# Patient Record
Sex: Male | Born: 1935 | Race: White | Hispanic: No | Marital: Married | Smoking: Former smoker
Health system: Southern US, Community
[De-identification: ages and names within clinical notes are randomized; demographics above are authoritative.]

## PROBLEM LIST (undated history)

## (undated) DIAGNOSIS — R918 Other nonspecific abnormal finding of lung field: Secondary | ICD-10-CM

## (undated) DIAGNOSIS — E7800 Pure hypercholesterolemia, unspecified: Secondary | ICD-10-CM

## (undated) DIAGNOSIS — R0602 Shortness of breath: Secondary | ICD-10-CM

## (undated) DIAGNOSIS — E039 Hypothyroidism, unspecified: Secondary | ICD-10-CM

## (undated) DIAGNOSIS — J449 Chronic obstructive pulmonary disease, unspecified: Secondary | ICD-10-CM

## (undated) DIAGNOSIS — M1990 Unspecified osteoarthritis, unspecified site: Secondary | ICD-10-CM

## (undated) DIAGNOSIS — C801 Malignant (primary) neoplasm, unspecified: Secondary | ICD-10-CM

## (undated) DIAGNOSIS — I499 Cardiac arrhythmia, unspecified: Secondary | ICD-10-CM

## (undated) DIAGNOSIS — Z930 Tracheostomy status: Secondary | ICD-10-CM

## (undated) DIAGNOSIS — J189 Pneumonia, unspecified organism: Secondary | ICD-10-CM

## (undated) DIAGNOSIS — I10 Essential (primary) hypertension: Secondary | ICD-10-CM

## (undated) DIAGNOSIS — E119 Type 2 diabetes mellitus without complications: Secondary | ICD-10-CM

## (undated) DIAGNOSIS — R52 Pain, unspecified: Secondary | ICD-10-CM

## (undated) HISTORY — PX: EYE SURGERY: SHX253

---

## 2001-06-17 HISTORY — PX: TOTAL LARYNGECTOMY: SHX2543

## 2009-03-30 ENCOUNTER — Other Ambulatory Visit: Admission: RE | Admit: 2009-03-30 | Discharge: 2009-03-30 | Payer: Self-pay | Admitting: Internal Medicine

## 2009-03-30 HISTORY — PX: BRONCHOSCOPY: SUR163

## 2009-04-24 HISTORY — PX: OTHER SURGICAL HISTORY: SHX169

## 2012-04-09 HISTORY — PX: BACK SURGERY: SHX140

## 2013-05-20 ENCOUNTER — Encounter (HOSPITAL_COMMUNITY): Payer: Self-pay | Admitting: Pharmacy Technician

## 2013-05-22 NOTE — H&P (Signed)
Ryan Salinas DOB: 01/19/1935 Male  Chief Complaint: right knee pain  History of Present Illness The patient is a 79 year old male who presented with a chief complaint of right knee pain. He reports that he has had right knee pain for about 6 weeks. No specific injury. He has had no prior history of knee problems. He says that the pain has become so severe that he is nearly unable to put weight on the right knee. He has minimal to no discomfort at rest. The pain is typically associated with weightbearing. He is pointing to all his pain being medially. He is having some issues with swelling and instability in the knee. Says he feels like something is getting caught in the knee. He has seen his primary care provider for this problem who has done x-rays, CT of the knee, as well as a cortisone injection. He says that the cortisone injection helped for a couple of weeks, but has worn off. He reports that he and his wife have been walking in the park for about 6 years, but obviously in the past month he has been unable to do that. He has been taking some Aleve, but is trying to get off of taking that. It does complicate some of his pulmonary issues. He denies groin pain. No numbness or tingling in the legs. MRI was ordered which showed a medial meniscus tear in the right knee.   Allergies Morphine Derivatives Cipro *FLUOROQUINOLONES*  Family History Cancer. mother Diabetes Mellitus. sister and brother Kidney disease. brother  Social History Alcohol use. current drinker; drinks beer and wine; only occasionally per week Children. 2 Current work status. 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 married Tobacco / smoke exposure. no Tobacco use. former smoker; smoke(d) 1 pack(s) per day  Medication History AmLODIPine Besylate (5MG  Tablet, Oral) Active. Benazepril HCl (40MG  Tablet, Oral) Active. MetFORMIN HCl ER (500MG  Tablet ER 24HR, Oral)  Active. Levothyroxine Sodium (100MCG Tablet, Oral) Active. PredniSONE (2.5MG  Tablet, Oral) Active. Labetalol HCl (300MG  Tablet, Oral) Active. CloNIDine HCl (0.1MG  Tablet, Oral) Active. GlipiZIDE XL (10MG  Tablet ER 24HR, Oral) Active. Furosemide (40MG  Tablet, Oral) Active. Pravastatin Sodium (20MG  Tablet, Oral) Active. Albuterol Sulfate (0.63MG /3ML Nebulized Soln, Inhalation) Active. Budesonide (0.25MG /2ML Suspension, Inhalation) Active. Sodium Chloride (Inhalant) (0.9% Nebulized Soln, Inhalation) Active. Aspirin (81MG  Tablet, 1 (one) Oral) Active.  Past Surgical History Cataract Surgery. bilateral Colon Polyp Removal - Colonoscopy Colon Polyp Removal - Open Spinal Fusion. lower back Spinal Surgery Tonsillectomy Vasectomy  Past Medical History Cancer Chronic Obstructive Lung Disease Diabetes Mellitus, Type II Heart murmur Hypertension Hypercholesterolemia Rheumatoid Arthritis  Review of Systems General:Not Present- Chills, Fever, Night Sweats, Appetite Loss, Fatigue, Feeling sick, Weight Gain and Weight Loss. Skin:Not Present- Itching, Rash, Skin Color Changes, Ulcer, Psoriasis and Change in Hair or Nails. HEENT:Not Present- Sensitivity to light, Hearing problems, Nose Bleed and Ringing in the Ears. Neck:Not Present- Swollen Glands and Neck Mass. Respiratory:Present- Dyspnea. Not Present- Snoring, Chronic Cough and Bloody sputum. Cardiovascular:Present- Shortness of Breath, Swelling of Extremities and Palpitations. Not Present- Chest Pain and Leg Cramps. Gastrointestinal:Present- Heartburn. Not Present- Bloody Stool, Abdominal Pain, Vomiting, Nausea and Incontinence of Stool. Male Genitourinary:Present- Frequency and Nocturia. Not Present- Blood in Urine and Incontinence. Musculoskeletal:Present- Back Pain. Not Present- Muscle Weakness, Muscle Pain, Joint Stiffness, Joint Swelling and Joint Pain. Neurological:Not Present- Tingling, Numbness, Burning, Tremor,  Headaches and Dizziness. Psychiatric:Not Present- Anxiety, Depression and Memory Loss. Endocrine:Not Present- Cold Intolerance, Heat Intolerance, Excessive hunger and Excessive Thirst. Hematology:Not Present-  Abnormal Bleeding, Anemia, Blood Clots and Easy Bruising.  Vitals Weight: 229 lb Height: 66 in Body Surface Area: 2.2 m Body Mass Index: 36.96 kg/m Pulse: 75 (Regular) BP: 155/87 (Sitting, Left Arm, Standard)  Physical Exam On physical exam he is alert and oriented. He is in no acute distress. He is nontender over the lateral joint line, but is significantly tender over the medial joint line. Calves are soft and nontender. Distal pulses are 2+. Sensation and motor function is intact in the lower extremities. He does have moderate patellofemoral crepitus. Mild effusion about the knee. Does have some tenderness in the popliteal space. Small Baker cyst is palpated. He does have pain with passive range of motion primarily at the endpoint of extension. He does lack a couple of degrees of extension. Minimal discomfort with flexion. Flexion back to 125 degrees. No instability is noted in the knee. RRR. Heart sounds normal. Lungs clear to auscultation. Abdomen soft and nontender. Bowel sounds active. Neck supple. EOM intact.   RADIOGRAPHS AP and lateral views of the right knee show some narrowing in all 3 compartments, but no bone on bone changes. No fractures. Does have some mild chondrocalcinosis in the medial compartment. The CT ordered by his PCP showed some early arthritic changes as well as a Baker cyst. No fractures.  Assessment & Plan Right knee medial meniscus tear He needs a right knee arthroscopy with medial menisectomy. Risks and benefits of the surgery discussed with the patient and his wife by Dr. Gladstone Lighter.     Ardeen Jourdain, PA-C

## 2013-05-23 ENCOUNTER — Encounter (HOSPITAL_COMMUNITY): Payer: Self-pay

## 2013-05-23 ENCOUNTER — Encounter (HOSPITAL_COMMUNITY)
Admission: RE | Admit: 2013-05-23 | Discharge: 2013-05-23 | Disposition: A | Discharge status: Home / Self Care | Payer: Medicare Other | Source: Ambulatory Visit | Attending: Orthopedic Surgery | Admitting: Orthopedic Surgery

## 2013-05-23 HISTORY — DX: Pain, unspecified: R52

## 2013-05-23 HISTORY — DX: Cardiac arrhythmia, unspecified: I49.9

## 2013-05-23 HISTORY — DX: Shortness of breath: R06.02

## 2013-05-23 HISTORY — DX: Pure hypercholesterolemia, unspecified: E78.00

## 2013-05-23 HISTORY — DX: Hypothyroidism, unspecified: E03.9

## 2013-05-23 HISTORY — DX: Malignant (primary) neoplasm, unspecified: C80.1

## 2013-05-23 HISTORY — DX: Other nonspecific abnormal finding of lung field: R91.8

## 2013-05-23 HISTORY — DX: Unspecified osteoarthritis, unspecified site: M19.90

## 2013-05-23 HISTORY — DX: Type 2 diabetes mellitus without complications: E11.9

## 2013-05-23 HISTORY — DX: Chronic obstructive pulmonary disease, unspecified: J44.9

## 2013-05-23 HISTORY — DX: Tracheostomy status: Z93.0

## 2013-05-23 HISTORY — DX: Essential (primary) hypertension: I10

## 2013-05-23 NOTE — Pre-Procedure Instructions (Signed)
DR. ROSE NOTIFIED PT HAS TRACHEOSTOMY - HX TOTAL LARYNGECTOMY FOR CA IN 2003; DIABETES, HYPERTENSION.  PT HAS CBC, DIFF, CMET AND OTHER LABS DONE BY HIS MEDICAL DOCTOR DR. Holley Raring ON 05/02/13, EKG AND CXR REPORTS 05/16/13 FROM DR. ARVIND - DR. ROSE STATES OK TO USE ALL REPORT FOR THIS SURGERY - NO OTHER LABS NEEDED. MEDICAL CLEARANCE FOR SURGERY AND OFFICE NOTES DR. ARVIND - [place]  05/16/13  ON PT'S CHART. PULMONOLOGY OFFICE NOTES DR. DONNER WITH [facility] ON CHART FROM 05/03/13. CARDIOLOGY OFFICE NOTE DR. [place] ON PT'S CHART FROM 04/21/13.

## 2013-05-23 NOTE — Patient Instructions (Signed)
   YOUR SURGERY IS SCHEDULED AT [facility]  ON:   Wednesday  3/4  REPORT TO  SHORT STAY CENTER AT:  6:30 AM      PHONE # FOR SHORT STAY IS [phone]  DO NOT EAT OR DRINK ANYTHING AFTER MIDNIGHT THE NIGHT BEFORE YOUR SURGERY.  YOU MAY BRUSH YOUR TEETH, RINSE OUT YOUR MOUTH--BUT NO WATER, NO FOOD, NO CHEWING GUM, NO MINTS, NO CANDIES, NO CHEWING TOBACCO.  PLEASE TAKE THE FOLLOWING MEDICATIONS THE AM OF YOUR SURGERY WITH A FEW SIPS OF WATER:  USE ALL OF YOUR NEBULIZER MEDICATIONS.  TAKE YOUR AMLODIPINE. LABETALOL, LEVOTHYROXINE, PREDNISONE.   IF YOU ARE DIABETIC:  DO NOT TAKE ANY DIABETIC MEDICATIONS THE AM OF YOUR SURGERY.     DO NOT BRING VALUABLES, MONEY, CREDIT CARDS.  DO NOT WEAR JEWELRY, MAKE-UP, NAIL POLISH AND NO METAL PINS OR CLIPS IN YOUR HAIR. CONTACT LENS, DENTURES / PARTIALS, GLASSES SHOULD NOT BE WORN TO SURGERY AND IN MOST CASES-HEARING AIDS WILL NEED TO BE REMOVED.  BRING YOUR GLASSES CASE, ANY EQUIPMENT NEEDED FOR YOUR CONTACT LENS. FOR PATIENTS ADMITTED TO THE HOSPITAL--CHECK OUT TIME THE DAY OF DISCHARGE IS 11:00 AM.  ALL INPATIENT ROOMS ARE PRIVATE - WITH BATHROOM, TELEPHONE, TELEVISION AND WIFI INTERNET.  IF YOU ARE BEING DISCHARGED THE SAME DAY OF YOUR SURGERY--YOU CAN NOT DRIVE YOURSELF HOME--AND SHOULD NOT GO HOME ALONE BY TAXI OR BUS.  NO DRIVING OR OPERATING MACHINERY, OR MAKING LEGAL DECISIONS FOR 24 HOURS FOLLOWING ANESTHESIA / PAIN MEDICATIONS.  PLEASE MAKE ARRANGEMENTS FOR SOMEONE TO BE WITH YOU AT HOME THE FIRST 24 HOURS AFTER SURGERY. RESPONSIBLE DRIVER'S NAME / PHONE                                                WIFE - Ryan Salinas  [phone]                                                     FAILURE TO FOLLOW THESE INSTRUCTIONS MAY RESULT IN THE CANCELLATION OF YOUR SURGERY. PLEASE BE AWARE THAT YOU MAY NEED ADDITIONAL BLOOD DRAWN DAY OF YOUR SURGERY  PATIENT SIGNATURE_________________________________

## 2013-05-23 NOTE — Progress Notes (Signed)
05/23/13 1030  OBSTRUCTIVE SLEEP APNEA  Have you ever been diagnosed with sleep apnea through a sleep study? No  Do you snore loudly (loud enough to be heard through closed doors)?  0  Do you often feel tired, fatigued, or sleepy during the daytime? 1  Has anyone observed you stop breathing during your sleep? 0  Do you have, or are you being treated for high blood pressure? 1  BMI more than 35 kg/m2? 1  Age over 77 years old? 1  Neck circumference greater than 40 cm/18 inches? 1  Gender: 1  Obstructive Sleep Apnea Score 6  Score 4 or greater  Results sent to PCP

## 2013-05-25 ENCOUNTER — Encounter (HOSPITAL_COMMUNITY): Payer: Medicare Other | Admitting: Certified Registered Nurse Anesthetist

## 2013-05-25 ENCOUNTER — Ambulatory Visit (HOSPITAL_COMMUNITY)
Admission: RE | Admit: 2013-05-25 | Discharge: 2013-05-25 | Disposition: A | Discharge status: Home / Self Care | Payer: Medicare Other | Source: Ambulatory Visit | Attending: Orthopedic Surgery | Admitting: Orthopedic Surgery

## 2013-05-25 ENCOUNTER — Ambulatory Visit (HOSPITAL_COMMUNITY): Payer: Medicare Other | Admitting: Certified Registered Nurse Anesthetist

## 2013-05-25 ENCOUNTER — Encounter (HOSPITAL_COMMUNITY)
Admission: RE | Disposition: A | Discharge status: Home / Self Care | Payer: Self-pay | Source: Ambulatory Visit | Attending: Orthopedic Surgery

## 2013-05-25 ENCOUNTER — Encounter (HOSPITAL_COMMUNITY): Payer: Self-pay | Admitting: *Deleted

## 2013-05-25 DIAGNOSIS — X58XXXA Exposure to other specified factors, initial encounter: Secondary | ICD-10-CM | POA: Insufficient documentation

## 2013-05-25 DIAGNOSIS — M2240 Chondromalacia patellae, unspecified knee: Secondary | ICD-10-CM | POA: Insufficient documentation

## 2013-05-25 DIAGNOSIS — E7800 Pure hypercholesterolemia, unspecified: Secondary | ICD-10-CM | POA: Insufficient documentation

## 2013-05-25 DIAGNOSIS — Z7982 Long term (current) use of aspirin: Secondary | ICD-10-CM | POA: Insufficient documentation

## 2013-05-25 DIAGNOSIS — I10 Essential (primary) hypertension: Secondary | ICD-10-CM | POA: Insufficient documentation

## 2013-05-25 DIAGNOSIS — M2340 Loose body in knee, unspecified knee: Secondary | ICD-10-CM | POA: Insufficient documentation

## 2013-05-25 DIAGNOSIS — S83289A Other tear of lateral meniscus, current injury, unspecified knee, initial encounter: Secondary | ICD-10-CM | POA: Diagnosis present

## 2013-05-25 DIAGNOSIS — R011 Cardiac murmur, unspecified: Secondary | ICD-10-CM | POA: Insufficient documentation

## 2013-05-25 DIAGNOSIS — E039 Hypothyroidism, unspecified: Secondary | ICD-10-CM | POA: Insufficient documentation

## 2013-05-25 DIAGNOSIS — Z87891 Personal history of nicotine dependence: Secondary | ICD-10-CM | POA: Insufficient documentation

## 2013-05-25 DIAGNOSIS — M069 Rheumatoid arthritis, unspecified: Secondary | ICD-10-CM | POA: Insufficient documentation

## 2013-05-25 DIAGNOSIS — Z79899 Other long term (current) drug therapy: Secondary | ICD-10-CM | POA: Insufficient documentation

## 2013-05-25 DIAGNOSIS — E119 Type 2 diabetes mellitus without complications: Secondary | ICD-10-CM | POA: Insufficient documentation

## 2013-05-25 DIAGNOSIS — M659 Unspecified synovitis and tenosynovitis, unspecified site: Secondary | ICD-10-CM | POA: Insufficient documentation

## 2013-05-25 DIAGNOSIS — M1711 Unilateral primary osteoarthritis, right knee: Secondary | ICD-10-CM | POA: Diagnosis present

## 2013-05-25 DIAGNOSIS — J449 Chronic obstructive pulmonary disease, unspecified: Secondary | ICD-10-CM | POA: Insufficient documentation

## 2013-05-25 DIAGNOSIS — M1710 Unilateral primary osteoarthritis, unspecified knee: Secondary | ICD-10-CM | POA: Insufficient documentation

## 2013-05-25 DIAGNOSIS — J4489 Other specified chronic obstructive pulmonary disease: Secondary | ICD-10-CM | POA: Insufficient documentation

## 2013-05-25 HISTORY — PX: KNEE ARTHROSCOPY WITH MEDIAL MENISECTOMY: SHX5651

## 2013-05-25 LAB — GLUCOSE, CAPILLARY: Glucose-Capillary: 167 mg/dL — ABNORMAL HIGH (ref 70–99)

## 2013-05-25 SURGERY — ARTHROSCOPY, KNEE, WITH MEDIAL MENISCECTOMY
Anesthesia: General | Site: Knee | Laterality: Right

## 2013-05-25 MED ORDER — LACTATED RINGERS IV SOLN
INTRAVENOUS | Status: DC
  Administered 2013-05-25 (×2): via INTRAVENOUS

## 2013-05-25 MED ORDER — PROPOFOL 10 MG/ML IV BOLUS
INTRAVENOUS | Status: DC | PRN
  Administered 2013-05-25: 180 mg via INTRAVENOUS

## 2013-05-25 MED ORDER — PROPOFOL 10 MG/ML IV BOLUS
INTRAVENOUS | Status: AC
  Filled 2013-05-25: qty 20

## 2013-05-25 MED ORDER — LACTATED RINGERS IR SOLN
Status: DC | PRN
  Administered 2013-05-25: 6000 mL

## 2013-05-25 MED ORDER — LIDOCAINE HCL (CARDIAC) 20 MG/ML IV SOLN
INTRAVENOUS | Status: AC
  Filled 2013-05-25: qty 5

## 2013-05-25 MED ORDER — LIDOCAINE HCL (CARDIAC) 20 MG/ML IV SOLN
INTRAVENOUS | Status: DC | PRN
  Administered 2013-05-25: 80 mg via INTRAVENOUS

## 2013-05-25 MED ORDER — EPHEDRINE SULFATE 50 MG/ML IJ SOLN
INTRAMUSCULAR | Status: AC
  Filled 2013-05-25: qty 1

## 2013-05-25 MED ORDER — MIDAZOLAM HCL 5 MG/5ML IJ SOLN
INTRAMUSCULAR | Status: DC | PRN
  Administered 2013-05-25: 1 mg via INTRAVENOUS

## 2013-05-25 MED ORDER — ONDANSETRON HCL 4 MG/2ML IJ SOLN
INTRAMUSCULAR | Status: DC | PRN
  Administered 2013-05-25: 4 mg via INTRAVENOUS

## 2013-05-25 MED ORDER — BUPIVACAINE-EPINEPHRINE PF 0.25-1:200000 % IJ SOLN
INTRAMUSCULAR | Status: AC
  Filled 2013-05-25: qty 30

## 2013-05-25 MED ORDER — PROMETHAZINE HCL 25 MG/ML IJ SOLN: 6.2500 mg | INTRAMUSCULAR | Status: DC | PRN

## 2013-05-25 MED ORDER — BUPIVACAINE-EPINEPHRINE 0.25% -1:200000 IJ SOLN
INTRAMUSCULAR | Status: DC | PRN
  Administered 2013-05-25: 30 mL

## 2013-05-25 MED ORDER — BACITRACIN-NEOMYCIN-POLYMYXIN 400-5-5000 EX OINT
TOPICAL_OINTMENT | CUTANEOUS | Status: AC
  Filled 2013-05-25: qty 1

## 2013-05-25 MED ORDER — ATROPINE SULFATE 0.4 MG/ML IJ SOLN
INTRAMUSCULAR | Status: AC
  Filled 2013-05-25: qty 1

## 2013-05-25 MED ORDER — ONDANSETRON HCL 4 MG/2ML IJ SOLN
INTRAMUSCULAR | Status: AC
  Filled 2013-05-25: qty 2

## 2013-05-25 MED ORDER — SODIUM CHLORIDE 0.9 % IJ SOLN
INTRAMUSCULAR | Status: AC
  Filled 2013-05-25: qty 10

## 2013-05-25 MED ORDER — CHLORHEXIDINE GLUCONATE 4 % EX LIQD: 60.0000 mL | Freq: Once | CUTANEOUS | Status: DC

## 2013-05-25 MED ORDER — BACITRACIN ZINC 500 UNIT/GM EX OINT
TOPICAL_OINTMENT | CUTANEOUS | Status: DC | PRN
  Administered 2013-05-25: 1 via TOPICAL

## 2013-05-25 MED ORDER — OXYCODONE-ACETAMINOPHEN 5-325 MG PO TABS
1.0000 | ORAL_TABLET | Freq: Once | ORAL | Status: AC
  Administered 2013-05-25: 1 via ORAL
  Filled 2013-05-25: qty 1

## 2013-05-25 MED ORDER — CEFAZOLIN SODIUM-DEXTROSE 2-3 GM-% IV SOLR
INTRAVENOUS | Status: AC
  Filled 2013-05-25: qty 50

## 2013-05-25 MED ORDER — FENTANYL CITRATE 0.05 MG/ML IJ SOLN
25.0000 ug | INTRAMUSCULAR | Status: DC | PRN
  Administered 2013-05-25: 25 ug via INTRAVENOUS

## 2013-05-25 MED ORDER — LACTATED RINGERS IV SOLN: INTRAVENOUS | Status: DC

## 2013-05-25 MED ORDER — PHENYLEPHRINE 40 MCG/ML (10ML) SYRINGE FOR IV PUSH (FOR BLOOD PRESSURE SUPPORT)
PREFILLED_SYRINGE | INTRAVENOUS | Status: AC
  Filled 2013-05-25: qty 10

## 2013-05-25 MED ORDER — BUPIVACAINE-EPINEPHRINE PF 0.5-1:200000 % IJ SOLN
INTRAMUSCULAR | Status: AC
  Filled 2013-05-25: qty 30

## 2013-05-25 MED ORDER — FENTANYL CITRATE 0.05 MG/ML IJ SOLN
INTRAMUSCULAR | Status: AC
  Filled 2013-05-25: qty 5

## 2013-05-25 MED ORDER — FENTANYL CITRATE 0.05 MG/ML IJ SOLN
INTRAMUSCULAR | Status: DC | PRN
  Administered 2013-05-25 (×4): 50 ug via INTRAVENOUS

## 2013-05-25 MED ORDER — MIDAZOLAM HCL 2 MG/2ML IJ SOLN
INTRAMUSCULAR | Status: AC
  Filled 2013-05-25: qty 2

## 2013-05-25 MED ORDER — OXYCODONE-ACETAMINOPHEN 10-325 MG PO TABS: 1.0000 | ORAL_TABLET | ORAL | Status: DC | PRN

## 2013-05-25 MED ORDER — FENTANYL CITRATE 0.05 MG/ML IJ SOLN
INTRAMUSCULAR | Status: AC
  Filled 2013-05-25: qty 2

## 2013-05-25 MED ORDER — CEFAZOLIN SODIUM-DEXTROSE 2-3 GM-% IV SOLR
2.0000 g | INTRAVENOUS | Status: AC
  Administered 2013-05-25: 2 g via INTRAVENOUS

## 2013-05-25 MED ORDER — PHENYLEPHRINE HCL 10 MG/ML IJ SOLN
INTRAMUSCULAR | Status: DC | PRN
  Administered 2013-05-25 (×2): 40 ug via INTRAVENOUS

## 2013-05-25 SURGICAL SUPPLY — 28 items
BANDAGE ELASTIC 4 VELCRO ST LF (GAUZE/BANDAGES/DRESSINGS) ×3 IMPLANT
BANDAGE ELASTIC 6 VELCRO ST LF (GAUZE/BANDAGES/DRESSINGS) ×3 IMPLANT
BANDAGE GAUZE ELAST BULKY 4 IN (GAUZE/BANDAGES/DRESSINGS) ×3 IMPLANT
BLADE GREAT WHITE 4.2 (BLADE) ×2 IMPLANT
BLADE GREAT WHITE 4.2MM (BLADE) ×1
BNDG COHESIVE 6X5 TAN STRL LF (GAUZE/BANDAGES/DRESSINGS) ×3 IMPLANT
DRAPE LG THREE QUARTER DISP (DRAPES) ×3 IMPLANT
DRSG ADAPTIC 3X8 NADH LF (GAUZE/BANDAGES/DRESSINGS) ×3 IMPLANT
DURAPREP 26ML APPLICATOR (WOUND CARE) ×3 IMPLANT
GLOVE BIOGEL PI IND STRL 8 (GLOVE) ×1 IMPLANT
GLOVE BIOGEL PI INDICATOR 8 (GLOVE) ×2
GLOVE ECLIPSE 8.0 STRL XLNG CF (GLOVE) ×3 IMPLANT
GOWN STRL REUS W/TWL XL LVL3 (GOWN DISPOSABLE) ×3 IMPLANT
MANIFOLD NEPTUNE II (INSTRUMENTS) ×3 IMPLANT
PACK ARTHROSCOPY WL (CUSTOM PROCEDURE TRAY) ×3 IMPLANT
PACK ICE MAXI GEL EZY WRAP (MISCELLANEOUS) ×3 IMPLANT
PAD ABD 8X10 STRL (GAUZE/BANDAGES/DRESSINGS) ×3 IMPLANT
PAD MASON LEG HOLDER (PIN) ×3 IMPLANT
SET ARTHROSCOPY TUBING (MISCELLANEOUS) ×2
SET ARTHROSCOPY TUBING LN (MISCELLANEOUS) ×1 IMPLANT
SPONGE GAUZE 4X4 12PLY (GAUZE/BANDAGES/DRESSINGS) ×3 IMPLANT
SUT ETHILON 3 0 PS 1 (SUTURE) ×3 IMPLANT
SYR 20CC LL (SYRINGE) ×3 IMPLANT
TOWEL OR 17X26 10 PK STRL BLUE (TOWEL DISPOSABLE) ×3 IMPLANT
TUBING CONNECTING 10 (TUBING) IMPLANT
TUBING CONNECTING 10' (TUBING)
WAND 90 DEG TURBOVAC W/CORD (SURGICAL WAND) ×3 IMPLANT
WRAP KNEE MAXI GEL POST OP (GAUZE/BANDAGES/DRESSINGS) ×3 IMPLANT

## 2013-05-25 NOTE — Anesthesia Preprocedure Evaluation (Addendum)
Anesthesia Evaluation  Patient identified by MRN, date of birth, ID band Patient awake    Reviewed: Allergy & Precautions, H&P , NPO status , Patient's Chart, lab work & pertinent test results  Airway Mallampati: III TM Distance: >3 FB Neck ROM: Full    Dental  (+) Edentulous Upper, Edentulous Lower   Pulmonary shortness of breath and with exertion, COPD COPD inhaler, former smoker,  Hx of laryngeal CA; S/P total laryngectomy breath sounds clear to auscultation  Pulmonary exam normal       Cardiovascular hypertension, Rhythm:Regular Rate:Normal     Neuro/Psych negative neurological ROS  negative psych ROS   GI/Hepatic negative GI ROS, Neg liver ROS,   Endo/Other  diabetes, Type 2, Oral Hypoglycemic AgentsHypothyroidism   Renal/GU negative Renal ROS  negative genitourinary   Musculoskeletal negative musculoskeletal ROS (+)   Abdominal   Peds  Hematology negative hematology ROS (+)   Anesthesia Other Findings Steroid dependent secondary chronic pulmonary fibrosis. Chronic tracheostomy site.  Reproductive/Obstetrics                         Anesthesia Physical Anesthesia Plan  ASA: III  Anesthesia Plan: General   Post-op Pain Management:    Induction: Intravenous  Airway Management Planned: Tracheostomy  Additional Equipment:   Intra-op Plan:   Post-operative Plan:   Informed Consent: I have reviewed the patients History and Physical, chart, labs and discussed the procedure including the risks, benefits and alternatives for the proposed anesthesia with the patient or authorized representative who has indicated his/her understanding and acceptance.   Dental advisory given  Plan Discussed with: CRNA  Anesthesia Plan Comments: (Intubation via tracheostomy site. )        Anesthesia Quick Evaluation

## 2013-05-25 NOTE — Anesthesia Postprocedure Evaluation (Signed)
Anesthesia Post Note  Patient: [name]  Procedure(s) Performed: Procedure(s) (LRB): RIGHT KNEE ARTHROSCOPY WITH LATERAL MENISCECTOMY, LOOSE BODY REMOVAL, ABRASION CHONDROPLASTY MEDIAL FEMORAL CONDYLE, SUPRAPATELLAR SYNOVECTOMY (Right)  Anesthesia type: General  Patient location: PACU  Post pain: Pain level controlled  Post assessment: Post-op Vital signs reviewed  Last Vitals:  Filed Vitals:   05/25/13 1109  BP: 180/92  Pulse:   Temp:   Resp:     Post vital signs: Reviewed  Level of consciousness: sedated  Complications: No apparent anesthesia complications

## 2013-05-25 NOTE — Preoperative (Signed)
Beta Blockers   Reason not to administer Beta Blockers:Not Applicable, Patient took labetolol 05/25/13 am.

## 2013-05-25 NOTE — Transfer of Care (Signed)
Immediate Anesthesia Transfer of Care Note  Patient: [name]  Procedure(s) Performed: Procedure(s) (LRB): RIGHT KNEE ARTHROSCOPY WITH LATERAL MENISCECTOMY, LOOSE BODY REMOVAL, ABRASION CHONDROPLASTY MEDIAL FEMORAL CONDYLE, SUPRAPATELLAR SYNOVECTOMY (Right)  Patient Location: PACU  Anesthesia Type: General  Level of Consciousness: sedated, patient cooperative and responds to stimulation  Airway & Oxygen Therapy: Patient Spontanous Breathing and Patient connected to face mask oxgen  Post-op Assessment: Report given to PACU RN and Post -op Vital signs reviewed and stable  Post vital signs: Reviewed and stable  Complications: No apparent anesthesia complications

## 2013-05-25 NOTE — Brief Op Note (Signed)
05/25/2013  9:46 AM  PATIENT:  Ryan Salinas  79 y.o. male  PRE-OPERATIVE DIAGNOSIS:  RIGHT KNEE MEDIAL MENISCUS TEAR  POST-OPERATIVE DIAGNOSIS:  RIGHT KNEE LATERAL MENISCUS TEAR, LOOSE BODY, CHONDROMALACIA, SYNOVITIS  PROCEDURE:  Procedure(s): RIGHT KNEE ARTHROSCOPY WITH LATERAL MENISCECTOMY, LOOSE BODY REMOVAL, ABRASION CHONDROPLASTY MEDIAL FEMORAL CONDYLE, SUPRAPATELLAR SYNOVECTOMY (Right)  SURGEON:  Surgeon(s) and Role:    * Tobi Bastos, MD - Primary  :   ASSISTANTS: OR Tech  ANESTHESIA:   general  EBL:  Total I/O In: 1100 [I.V.:1100] Out: -   BLOOD ADMINISTERED:none  DRAINS: none   LOCAL MEDICATIONS USED:  MARCAINE  30cc of 0.50% with Epinephrine   SPECIMEN:  No Specimen  DISPOSITION OF SPECIMEN:  N/A  COUNTS:  YES  TOURNIQUET:  * No tourniquets in log *  DICTATION: .Other Dictation: Dictation Number [id number]  PLAN OF CARE: Discharge to home after PACU  PATIENT DISPOSITION:  PACU - hemodynamically stable.   Delay start of Pharmacological VTE agent (>24hrs) due to surgical blood loss or risk of bleeding: yes

## 2013-05-25 NOTE — Interval H&P Note (Signed)
History and Physical Interval Note:  05/25/2013 8:08 AM  Ryan Salinas  has presented today for surgery, with the diagnosis of RIGHT KNEE MEDIAL MENISCUS TEAR  The various methods of treatment have been discussed with the patient and family. After consideration of risks, benefits and other options for treatment, the patient has consented to  Procedure(s): RIGHT KNEE ARTHROSCOPY WITH MEDIAL MENISECTOMY (Right) as a surgical intervention .  The patient's history has been reviewed, patient examined, no change in status, stable for surgery.  I have reviewed the patient's chart and labs.  Questions were answered to the patient's satisfaction.     [name]

## 2013-05-26 ENCOUNTER — Encounter (HOSPITAL_COMMUNITY): Payer: Self-pay | Admitting: Orthopedic Surgery

## 2013-05-26 NOTE — Op Note (Signed)
NAMEFRENCH, KENDRA                ACCOUNT NO.:  [id number]  MEDICAL RECORD NO.:  [id number]  LOCATION:  [facility]                         FACILITY:  [facility]  PHYSICIAN:  Kipp Brood. Duane Earnshaw, M.D.DATE OF BIRTH:  12/16/1934  DATE OF PROCEDURE:  05/25/2013 DATE OF DISCHARGE:  05/25/2013                              OPERATIVE REPORT   SURGEON:  Jori Moll A. Rohith Fauth, [name]  ASSISTANT:  OR tech.  PREOPERATIVE DIAGNOSES: 1. Osteoarthritis of the right knee. 2. Torn lateral meniscus, right knee. 3. Loose body, right knee.  POSTOPERATIVE DIAGNOSES: 1. Osteoarthritis of the right knee. 2. Torn lateral meniscus, right knee. 3. Loose body, right knee.  OPERATION: 1. Diagnostic arthroscopy, right knee. 2. Removal of loose body from the medial joint, right knee. 3. Abrasion chondroplasty of the medial femoral condyle, right knee. 4. Lateral meniscectomy, right knee. 5. Synovectomy, suprapatellar pouch, right knee.  PROCEDURE:  Under general anesthesia, routine orthopedic prep and draping of the right lower extremity was carried out with the right knee in a knee holder.  The appropriate time-out was first carried out.  I also marked the appropriate right leg in the holding area.  At this time, a small punctate incision made in the suprapatellar pouch, inflow cannula was inserted, and the knee was distended with saline.  Another small punctate incision made in the anterolateral joint.  The arthroscope was entered from lateral approach and a complete diagnostic arthroscopy was carried out.  I went up in the suprapatellar pouch, he had severe chronic thickened synovial synovitis.  I had utilized the [employer] and did a synovectomy.  I then went down into the medial joint, the medial meniscus was definitely intact.  There was no meniscectomy necessary.  There was a large loose body that I removed from the medial joint.  Also, his medial joint was completely eroded and thoroughly cleaned out the joint  as well as did an abrasion chondroplasty of the medial femoral condyle.  Note, the involvement was too extensive to do a microfracture.  Cruciates were intact.  I went over the lateral joint, he had multiple peripheral tear of the lateral meniscus.  I did a partial lateral meniscectomy.  I thoroughly irrigated out the knee and removed the fluid.  Closed all 3 punctate incisions with 3-0 nylon suture.  I injected 30 mL of 0.25% Marcaine with epinephrine in the knee joint.  Sterile Neosporin dressing was applied. He also had 2 g of IV Ancef preop.  Postop, he will be on aspirin 325 mg b.i.d. starting today and b.i.d. for 2 weeks as an anticoagulant.  He will be on full weightbearing with a walker.  He will see me in 10-12 days or prior to if there is any signs of any calf tenderness nor any high fevers or wound infection sites.          ______________________________ [name], [name]     RAG/MEDQ  D:  05/25/2013  T:  05/26/2013  Job:  [id number]

## 2013-11-18 ENCOUNTER — Other Ambulatory Visit: Payer: Self-pay | Admitting: Surgical

## 2013-11-18 ENCOUNTER — Encounter (HOSPITAL_COMMUNITY): Payer: Self-pay | Admitting: Pharmacy Technician

## 2013-11-18 MED ORDER — DEXAMETHASONE SODIUM PHOSPHATE 10 MG/ML IJ SOLN
10.0000 mg | Freq: Once | INTRAMUSCULAR | Status: AC
  Administered 2016-12-10: 10 mg via INTRAVENOUS

## 2013-11-21 NOTE — Patient Instructions (Signed)
Ryan Salinas  11/21/2013   Your procedure is scheduled on:  11/30/2013  Report to [facility].  Follow the Signs to [place] at   0900     am  Call this number if you have problems the morning of surgery: [phone]   Remember:   Do not eat food or drink liquids after midnight.   Take these medicines the morning of surgery with A SIP OF WATER:    Do not wear jewelry.  Do not wear lotions, powders, or perfumes, deodorant.  Men may shave face and neck.  Do not bring valuables to the hospital.  Contacts, dentures or bridgework may not be worn into surgery.  Leave suitcase in the car. After surgery it may be brought to your room.  For patients admitted to the hospital, checkout time is 11:00 AM the day of  discharge. [place] - Preparing for Surgery Before surgery, you can play an important role.  Because skin is not sterile, your skin needs to be as free of germs as possible.  You can reduce the number of germs on your skin by washing with CHG (chlorahexidine gluconate) soap before surgery.  CHG is an antiseptic cleaner which kills germs and bonds with the skin to continue killing germs even after washing. Please DO NOT use if you have an allergy to CHG or antibacterial soaps.  If your skin becomes reddened/irritated stop using the CHG and inform your nurse when you arrive at Short Stay. Do not shave (including legs and underarms) for at least 48 hours prior to the first CHG shower.  You may shave your face/neck. Please follow these instructions carefully:  1.  Shower with CHG Soap the night before surgery and the  morning of Surgery.  2.  If you choose to wash your hair, wash your hair first as usual with your  normal  shampoo.  3.  After you shampoo, rinse your hair and body thoroughly to remove the  shampoo.                           4.  Use CHG as you would any other liquid soap.  You can apply chg directly  to the skin and wash                       Gently with a  scrungie or clean washcloth.  5.  Apply the CHG Soap to your body ONLY FROM THE NECK DOWN.   Do not use on face/ open                           Wound or open sores. Avoid contact with eyes, ears mouth and genitals (private parts).                       Wash face,  Genitals (private parts) with your normal soap.             6.  Wash thoroughly, paying special attention to the area where your surgery  will be performed.  7.  Thoroughly rinse your body with warm water from the neck down.  8.  DO NOT shower/wash with your normal soap after using and rinsing off  the CHG Soap.                9.  Pat yourself dry with  a clean towel.            10.  Wear clean pajamas.            11.  Place clean sheets on your bed the night of your first shower and do not  sleep with pets. Day of Surgery : Do not apply any lotions/deodorants the morning of surgery.  Please wear clean clothes to the hospital/surgery center.  FAILURE TO FOLLOW THESE INSTRUCTIONS MAY RESULT IN THE CANCELLATION OF YOUR SURGERY PATIENT SIGNATURE_________________________________  NURSE SIGNATURE__________________________________  ________________________________________________________________________  WHAT IS A BLOOD TRANSFUSION? Blood Transfusion Information  A transfusion is the replacement of blood or some of its parts. Blood is made up of multiple cells which provide different functions.  Red blood cells carry oxygen and are used for blood loss replacement.  White blood cells fight against infection.  Platelets control bleeding.  Plasma helps clot blood.  Other blood products are available for specialized needs, such as hemophilia or other clotting disorders. BEFORE THE TRANSFUSION  Who gives blood for transfusions?   Healthy volunteers who are fully evaluated to make sure their blood is safe. This is blood bank blood. Transfusion therapy is the safest it has ever been in the practice of medicine. Before blood is taken  from a donor, a complete history is taken to make sure that person has no history of diseases nor engages in risky social behavior (examples are intravenous drug use or sexual activity with multiple partners). The donor's travel history is screened to minimize risk of transmitting infections, such as malaria. The donated blood is tested for signs of infectious diseases, such as HIV and hepatitis. The blood is then tested to be sure it is compatible with you in order to minimize the chance of a transfusion reaction. If you or a relative donates blood, this is often done in anticipation of surgery and is not appropriate for emergency situations. It takes many days to process the donated blood. RISKS AND COMPLICATIONS Although transfusion therapy is very safe and saves many lives, the main dangers of transfusion include:   Getting an infectious disease.  Developing a transfusion reaction. This is an allergic reaction to something in the blood you were given. Every precaution is taken to prevent this. The decision to have a blood transfusion has been considered carefully by your caregiver before blood is given. Blood is not given unless the benefits outweigh the risks. AFTER THE TRANSFUSION  Right after receiving a blood transfusion, you will usually feel much better and more energetic. This is especially true if your red blood cells have gotten low (anemic). The transfusion raises the level of the red blood cells which carry oxygen, and this usually causes an energy increase.  The nurse administering the transfusion will monitor you carefully for complications. HOME CARE INSTRUCTIONS  No special instructions are needed after a transfusion. You may find your energy is better. Speak with your caregiver about any limitations on activity for underlying diseases you may have. SEEK MEDICAL CARE IF:   Your condition is not improving after your transfusion.  You develop redness or irritation at the  intravenous (IV) site. SEEK IMMEDIATE MEDICAL CARE IF:  Any of the following symptoms occur over the next 12 hours:  Shaking chills.  You have a temperature by mouth above 102 F (38.9 C), not controlled by medicine.  Chest, back, or muscle pain.  People around you feel you are not acting correctly or are confused.  Shortness of breath  or difficulty breathing.  Dizziness and fainting.  You get a rash or develop hives.  You have a decrease in urine output.  Your urine turns a dark color or changes to pink, red, or brown. Any of the following symptoms occur over the next 10 days:  You have a temperature by mouth above 102 F (38.9 C), not controlled by medicine.  Shortness of breath.  Weakness after normal activity.  The white part of the eye turns yellow (jaundice).  You have a decrease in the amount of urine or are urinating less often.  Your urine turns a dark color or changes to pink, red, or brown. Document Released: 03/07/2000 Document Revised: 06/02/2011 Document Reviewed: 10/25/2007 ExitCare Patient Information 2014 [place].  _______________________________________________________________________  Incentive Spirometer  An incentive spirometer is a tool that can help keep your lungs clear and active. This tool measures how well you are filling your lungs with each breath. Taking long deep breaths may help reverse or decrease the chance of developing breathing (pulmonary) problems (especially infection) following:  A long period of time when you are unable to move or be active. BEFORE THE PROCEDURE   If the spirometer includes an indicator to show your best effort, your nurse or respiratory therapist will set it to a desired goal.  If possible, sit up straight or lean slightly forward. Try not to slouch.  Hold the incentive spirometer in an upright position. INSTRUCTIONS FOR USE  1. Sit on the edge of your bed if possible, or sit up as far as you can  in bed or on a chair. 2. Hold the incentive spirometer in an upright position. 3. Breathe out normally. 4. Place the mouthpiece in your mouth and seal your lips tightly around it. 5. Breathe in slowly and as deeply as possible, raising the piston or the ball toward the top of the column. 6. Hold your breath for 3-5 seconds or for as long as possible. Allow the piston or ball to fall to the bottom of the column. 7. Remove the mouthpiece from your mouth and breathe out normally. 8. Rest for a few seconds and repeat Steps 1 through 7 at least 10 times every 1-2 hours when you are awake. Take your time and take a few normal breaths between deep breaths. 9. The spirometer may include an indicator to show your best effort. Use the indicator as a goal to work toward during each repetition. 10. After each set of 10 deep breaths, practice coughing to be sure your lungs are clear. If you have an incision (the cut made at the time of surgery), support your incision when coughing by placing a pillow or rolled up towels firmly against it. Once you are able to get out of bed, walk around indoors and cough well. You may stop using the incentive spirometer when instructed by your caregiver.  RISKS AND COMPLICATIONS  Take your time so you do not get dizzy or light-headed.  If you are in pain, you may need to take or ask for pain medication before doing incentive spirometry. It is harder to take a deep breath if you are having pain. AFTER USE  Rest and breathe slowly and easily.  It can be helpful to keep track of a log of your progress. Your caregiver can provide you with a simple table to help with this. If you are using the spirometer at home, follow these instructions: [place] IF:   You are having difficultly using the  spirometer.  You have trouble using the spirometer as often as instructed.  Your pain medication is not giving enough relief while using the spirometer.  You develop fever of  100.5 F (38.1 C) or higher. SEEK IMMEDIATE MEDICAL CARE IF:   You cough up bloody sputum that had not been present before.  You develop fever of 102 F (38.9 C) or greater.  You develop worsening pain at or near the incision site. MAKE SURE YOU:   Understand these instructions.  Will watch your condition.  Will get help right away if you are not doing well or get worse. Document Released: 07/21/2006 Document Revised: 06/02/2011 Document Reviewed: 09/21/2006 ExitCare Patient Information 2014 [employer], [place].   ________________________________________________________________________   Please read over the following fact sheets that you were given: MRSA Information, coughing and deep breathing exercises, leg exercises

## 2013-11-22 ENCOUNTER — Encounter (HOSPITAL_COMMUNITY)
Admission: RE | Admit: 2013-11-22 | Discharge: 2013-11-22 | Disposition: A | Discharge status: Home / Self Care | Payer: Medicare Other | Source: Ambulatory Visit | Attending: Orthopedic Surgery | Admitting: Orthopedic Surgery

## 2013-11-22 ENCOUNTER — Encounter (HOSPITAL_COMMUNITY): Payer: Self-pay

## 2013-11-22 DIAGNOSIS — M1710 Unilateral primary osteoarthritis, unspecified knee: Secondary | ICD-10-CM | POA: Insufficient documentation

## 2013-11-22 DIAGNOSIS — Z01818 Encounter for other preprocedural examination: Secondary | ICD-10-CM | POA: Insufficient documentation

## 2013-11-22 HISTORY — DX: Pneumonia, unspecified organism: J18.9

## 2013-11-22 LAB — SURGICAL PCR SCREEN
MRSA, PCR: NEGATIVE
STAPHYLOCOCCUS AUREUS: NEGATIVE

## 2013-11-22 LAB — URINALYSIS, ROUTINE W REFLEX MICROSCOPIC
Bilirubin Urine: NEGATIVE
Glucose, UA: >1000 mg/dL — AB
Hgb urine dipstick: NEGATIVE
Ketones, ur: NEGATIVE mg/dL
Leukocytes, UA: NEGATIVE
Nitrite: NEGATIVE
Protein, ur: 30 mg/dL — AB
Specific Gravity, Urine: 1.028 (ref 1.005–1.030)
Urobilinogen, UA: 1 mg/dL (ref 0.0–1.0)
pH: 6 (ref 5.0–8.0)

## 2013-11-22 LAB — ABO/RH: ABO/RH(D): A POS

## 2013-11-22 LAB — PROTIME-INR
INR: 1 (ref 0.00–1.49)
Prothrombin Time: 13.2 seconds (ref 11.6–15.2)

## 2013-11-22 LAB — COMPREHENSIVE METABOLIC PANEL
ALT: 17 U/L (ref 0–53)
AST: 13 U/L (ref 0–37)
Albumin: 3.5 g/dL (ref 3.5–5.2)
Alkaline Phosphatase: 86 U/L (ref 39–117)
Anion gap: 15 (ref 5–15)
BUN: 20 mg/dL (ref 6–23)
CO2: 25 mEq/L (ref 19–32)
Calcium: 9.1 mg/dL (ref 8.4–10.5)
Chloride: 100 mEq/L (ref 96–112)
Creatinine, Ser: 1.16 mg/dL (ref 0.50–1.35)
GFR calc Af Amer: 68 mL/min — ABNORMAL LOW (ref >90)
GFR calc non Af Amer: 59 mL/min — ABNORMAL LOW (ref >90)
Glucose, Bld: 312 mg/dL — ABNORMAL HIGH (ref 70–99)
Potassium: 4.5 mEq/L (ref 3.7–5.3)
Sodium: 140 mEq/L (ref 137–147)
Total Bilirubin: 0.3 mg/dL (ref 0.3–1.2)
Total Protein: 6.8 g/dL (ref 6.0–8.3)

## 2013-11-22 LAB — URINE MICROSCOPIC-ADD ON

## 2013-11-22 LAB — CBC
HCT: 38.2 % — ABNORMAL LOW (ref 39.0–52.0)
Hemoglobin: 12.9 g/dL — ABNORMAL LOW (ref 13.0–17.0)
MCH: 28.3 pg (ref 26.0–34.0)
MCHC: 33.8 g/dL (ref 30.0–36.0)
MCV: 83.8 fL (ref 78.0–100.0)
Platelets: 218 10*3/uL (ref 150–400)
RBC: 4.56 MIL/uL (ref 4.22–5.81)
RDW: 14.3 % (ref 11.5–15.5)
WBC: 10.1 10*3/uL (ref 4.0–10.5)

## 2013-11-22 LAB — APTT: aPTT: 28 seconds (ref 24–37)

## 2013-11-22 NOTE — Progress Notes (Signed)
Requested chest xray report from 11/10/13 from Dr Welford Roche office. They will fax.

## 2013-11-22 NOTE — Progress Notes (Signed)
Pt's stop-bang apnea tool score 5 on preop appt of 11/22/2013. FYI

## 2013-11-23 ENCOUNTER — Other Ambulatory Visit: Payer: Self-pay | Admitting: Surgical

## 2013-11-23 NOTE — H&P (Signed)
TOTAL KNEE ADMISSION H&P  Patient is being admitted for right total knee arthroplasty.  Subjective:  Chief Complaint:right knee pain.  HPI: Ryan Salinas, 79 y.o. male, has a history of pain and functional disability in the right knee due to arthritis and has failed non-surgical conservative treatments for greater than 12 weeks to includeNSAID's and/or analgesics, corticosteriod injections, viscosupplementation injections, use of assistive devices and activity modification.  Onset of symptoms was gradual, starting 5 years ago with gradually worsening course since that time. The patient noted prior procedures on the knee to include  arthroscopy and menisectomy on the right knee(s).  Patient currently rates pain in the right knee(s) at 8 out of 10 with activity. Patient has night pain, worsening of pain with activity and weight bearing, pain that interferes with activities of daily living, pain with passive range of motion, crepitus and joint swelling.  Patient has evidence of periarticular osteophytes and joint space narrowing by imaging studies. There is no active infection.  Patient Active Problem List   Diagnosis Date Noted  . Osteoarthritis of right knee 05/25/2013  . Lateral meniscus tear 05/25/2013   Past Medical History  Diagnosis Date  . 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 Dysrhythmia     HX OF ATRIAL PREMATURE CONTRACTIONS--DR. WALLMEYER IS PT'S CARDIOLOGIST - [facility] [place]  . Hypertension   . Hypercholesterolemia   . Cancer     LARYNGEAL CANCER  . Diabetes mellitus without complication   . Multiple nodules of lung     RHEUMATOID ARTHRITIS OF LUNGS -TAKES DAILY PREDNISONE; POST INFLAMMATORY PULMONARY FIBROSIS, FIBROTIC CHANGES BOTH LUNGS; HAS A COUGH AND PHLEGM  . Hypothyroidism   . Shortness of breath     WITH ANY EXERTION   . Pain     IN RIGHT KNEE - HAS MENISCAL TEAR  . COPD (chronic  obstructive pulmonary disease)     MODERATE-SEVERE - PULMONOGIST IS DR. MARK DONER WITH [facility] [place]  . Arthritis       . Pneumonia     hx of     Past Surgical History  Procedure Laterality Date  . Total laryngectomy  06-17-2001    DONE AT [facility]  . Eye surgery      CATARACT EXTRACTIONS LEFT EY 08-12-06 AND RIGHT 09-18-10  . Bronchoscopy  03-30-09    FOR BLEEDING IN LUNGS - PT DIAGNOSED WITH RA OF LUNGS  . Thoracotomy and thoracoscopy  FEB 2011    FOR BLEEDING INTO LUNGS AND BX   . Back surgery  1-17 2014    LUMBAR SURGERY / FUSION WITH HARDWARE  - AT [facility]   PREVIOUS HX OF 2 LUMBAR SURGERIES BACK IN THE EARLY 80"S  . Knee arthroscopy with medial menisectomy Right 05/25/2013    Procedure: RIGHT KNEE ARTHROSCOPY WITH LATERAL MENISCECTOMY, LOOSE BODY REMOVAL, ABRASION CHONDROPLASTY MEDIAL FEMORAL CONDYLE, SUPRAPATELLAR SYNOVECTOMY;  Surgeon: Tobi Bastos, MD;  Location: [facility] ORS;  Service: Orthopedics;  Laterality: Right;     Current outpatient prescriptions: albuterol (PROVENTIL) (2.5 MG/3ML) 0.083% nebulizer solution, Take 2.5 mg by nebulization every 6 (six) hours as needed for wheezing or shortness of breath., Disp: , Rfl: ;   amLODipine (NORVASC) 5 MG tablet, Take 5 mg by mouth every morning., Disp: , Rfl: ;   benazepril (LOTENSIN) 40 MG tablet, Take 40 mg by mouth 2 (two) times daily., Disp: ,  Rfl:  budesonide (PULMICORT) 0.25 MG/2ML nebulizer solution, Take 0.25 mg by nebulization every morning., Disp: , Rfl: ;   cloNIDine (CATAPRES) 0.1 MG tablet, Take 0.1 mg by mouth at bedtime., Disp: , Rfl: ;   furosemide (LASIX) 40 MG tablet, Take 20 mg by mouth daily. TAKES 1/2 TAB EVERY AM, Disp: , Rfl: ;   glipiZIDE (GLUCOTROL XL) 10 MG 24 hr tablet, Take 10 mg by mouth every evening., Disp: , Rfl:  labetalol (NORMODYNE) 300 MG tablet, Take 300 mg by mouth 2 (two) times daily., Disp: , Rfl: ;   levothyroxine (SYNTHROID, LEVOTHROID)  100 MCG tablet, Take 100 mcg by mouth daily before breakfast., Disp: , Rfl: ;   metFORMIN (GLUCOPHAGE) 500 MG tablet, Take 1,000 mg by mouth 2 (two) times daily with a meal., Disp: , Rfl: ;   naproxen sodium (ANAPROX) 220 MG tablet, Take 440 mg by mouth 2 (two) times daily as needed (Pain)., Disp: , Rfl:  pravastatin (PRAVACHOL) 20 MG tablet, Take 20 mg by mouth at bedtime., Disp: , Rfl: ;   predniSONE (DELTASONE) 2.5 MG tablet, Take 2.5 mg by mouth 2 (two) times daily.   sodium chloride 0.9 % nebulizer solution, Take 3 mLs by nebulization as needed (Dryness in lungs)., Disp: , Rfl:    Allergies  Allergen Reactions  . Ciprofloxacin Anaphylaxis  . Morphine And Related Nausea And Vomiting    History  Substance Use Topics  . Smoking status: Former Smoker -- 1.00 packs/day for 25 years    Types: Cigarettes  . Smokeless tobacco: Never Used  . Alcohol Use: Yes     Comment: rarely       Review of Systems  Constitutional: Negative.   HENT: Positive for hearing loss. Negative for congestion, ear discharge, ear pain, nosebleeds, sore throat and tinnitus.        Full plate upper and lower dentures  Eyes: Negative.   Respiratory: Positive for cough, sputum production, shortness of breath and wheezing. Negative for hemoptysis and stridor.        SOB on exertion  Cardiovascular: Negative.   Gastrointestinal: Positive for heartburn. Negative for nausea, vomiting, abdominal pain, diarrhea, constipation, blood in stool and melena.  Genitourinary: Positive for frequency. Negative for dysuria, urgency, hematuria and flank pain.  Musculoskeletal: Positive for back pain and joint pain. Negative for falls, myalgias and neck pain.       Right knee pain  Skin: Negative.   Neurological: Negative.  Negative for headaches.  Endo/Heme/Allergies: Negative.   Psychiatric/Behavioral: Negative.     Objective:  Physical Exam  Constitutional: He is oriented to person, place, and time. He appears  well-developed. No distress.  Morbidly obese  HENT:  Head: Normocephalic and atraumatic.  Right Ear: External ear normal.  Left Ear: External ear normal.  Nose: Nose normal.  Mouth/Throat: Oropharynx is clear and moist.  Eyes: Conjunctivae and EOM are normal.  Neck: Normal range of motion. Neck supple.  Tracheostomy  Cardiovascular: Normal rate, normal heart sounds and intact distal pulses.  A regularly irregular rhythm present.  No murmur heard. Respiratory: Effort normal. No respiratory distress. He has decreased breath sounds. He has no wheezes.  GI: Soft. Bowel sounds are normal. He exhibits no distension. There is no tenderness.  Musculoskeletal:       Right hip: Normal.       Left hip: Normal.       Right knee: He exhibits decreased range of motion and swelling. He exhibits no effusion and no  erythema. Tenderness found. Medial joint line and lateral joint line tenderness noted.       Left knee: He exhibits decreased range of motion. He exhibits no swelling, no effusion and no erythema. No tenderness found.       Right lower leg: He exhibits edema. He exhibits no tenderness.       Left lower leg: He exhibits edema. He exhibits no tenderness.  Right knee 5-115 degrees. Genu varus deformity  Neurological: He is alert and oriented to person, place, and time. He has normal strength and normal reflexes. No sensory deficit.  Skin: No rash noted. He is not diaphoretic. No erythema.  Psychiatric: He has a normal mood and affect. His behavior is normal.    Vital signs in last 24 hours: Temp:  [98.4 F (36.9 C)] 98.4 F (36.9 C) (09/01 1421) Pulse Rate:  [82] 82 (09/01 1421) Resp:  [20] 20 (09/01 1421) BP: (149)/(72) 149/72 mmHg (09/01 1421) SpO2:  [98 %] 98 % (09/01 1421) Weight:  [105.235 kg (232 lb)] 105.235 kg (232 lb) (09/01 1421)    Imaging Review Plain radiographs demonstrate severe degenerative joint disease of the right knee(s). The overall alignment ismild varus. The  bone quality appears to be good for age and reported activity level.  Assessment/Plan:  End stage arthritis, right knee   The patient history, physical examination, clinical judgment of the provider and imaging studies are consistent with end stage degenerative joint disease of the right knee(s) and total knee arthroplasty is deemed medically necessary. The treatment options including medical management, injection therapy arthroscopy and arthroplasty were discussed at length. The risks and benefits of total knee arthroplasty were presented and reviewed. The risks due to aseptic loosening, infection, stiffness, patella tracking problems, thromboembolic complications and other imponderables were discussed. The patient acknowledged the explanation, agreed to proceed with the plan and consent was signed. Patient is being admitted for inpatient treatment for surgery, pain control, PT, OT, prophylactic antibiotics, VTE prophylaxis, progressive ambulation and ADL's and discharge planning. The patient is planning to be discharged home with home health services   Patient to receive TXA  Medical Providers Dr. Welford Roche Dr. Jimmie Molly Dr. Virginia Rochester  Surgical Clearance note from Dr. Welford Roche: Patient is to take his albuterol nebulizer preop and should receive stress dose of steroids postoperatively. Chest x-rays look stable. No need for chest CT.   Ardeen Jourdain, PA-C

## 2013-11-23 NOTE — Progress Notes (Signed)
EKG 04/15/12 on chart  09/19/2013-Dr [facility]( cardiology ) office visit note on chart - clearance on chart  ECHO 08/03/2009 on chart  Pulmonary note- 11/10/13 note on chart  EKG- 11/22/2013 on chart and in EPIC   CXR- 11/10/2013 on chart

## 2013-11-23 NOTE — Progress Notes (Signed)
Patient does not use any trach supplies with trach.  Patient states that about once  A year trach will start leaking and he will have to have new cuff changed out.  Last time this was done was approximately 3 months ago.

## 2013-11-30 ENCOUNTER — Encounter (HOSPITAL_COMMUNITY): Payer: Self-pay | Admitting: *Deleted

## 2013-11-30 ENCOUNTER — Inpatient Hospital Stay (HOSPITAL_COMMUNITY): Payer: Medicare Other | Admitting: Anesthesiology

## 2013-11-30 ENCOUNTER — Encounter (HOSPITAL_COMMUNITY)
Admission: RE | Disposition: A | Discharge status: Home Health | Payer: Self-pay | Source: Ambulatory Visit | Attending: Orthopedic Surgery

## 2013-11-30 ENCOUNTER — Encounter (HOSPITAL_COMMUNITY): Payer: Medicare Other | Admitting: Anesthesiology

## 2013-11-30 ENCOUNTER — Inpatient Hospital Stay (HOSPITAL_COMMUNITY)
Admission: RE | Admit: 2013-11-30 | Discharge: 2013-12-03 | DRG: 470 | Disposition: A | Discharge status: Home Health | Payer: Medicare Other | Source: Ambulatory Visit | Attending: Orthopedic Surgery | Admitting: Orthopedic Surgery

## 2013-11-30 DIAGNOSIS — IMO0002 Reserved for concepts with insufficient information to code with codable children: Secondary | ICD-10-CM

## 2013-11-30 DIAGNOSIS — M24569 Contracture, unspecified knee: Secondary | ICD-10-CM | POA: Diagnosis present

## 2013-11-30 DIAGNOSIS — J449 Chronic obstructive pulmonary disease, unspecified: Secondary | ICD-10-CM | POA: Diagnosis present

## 2013-11-30 DIAGNOSIS — M25569 Pain in unspecified knee: Secondary | ICD-10-CM | POA: Diagnosis present

## 2013-11-30 DIAGNOSIS — M0510 Rheumatoid lung disease with rheumatoid arthritis of unspecified site: Secondary | ICD-10-CM | POA: Diagnosis present

## 2013-11-30 DIAGNOSIS — Z96659 Presence of unspecified artificial knee joint: Secondary | ICD-10-CM

## 2013-11-30 DIAGNOSIS — Z8521 Personal history of malignant neoplasm of larynx: Secondary | ICD-10-CM

## 2013-11-30 DIAGNOSIS — Z930 Tracheostomy status: Secondary | ICD-10-CM

## 2013-11-30 DIAGNOSIS — Z96651 Presence of right artificial knee joint: Secondary | ICD-10-CM

## 2013-11-30 DIAGNOSIS — M1710 Unilateral primary osteoarthritis, unspecified knee: Secondary | ICD-10-CM | POA: Diagnosis present

## 2013-11-30 DIAGNOSIS — E119 Type 2 diabetes mellitus without complications: Secondary | ICD-10-CM | POA: Diagnosis present

## 2013-11-30 DIAGNOSIS — J8410 Pulmonary fibrosis, unspecified: Secondary | ICD-10-CM | POA: Diagnosis present

## 2013-11-30 DIAGNOSIS — E039 Hypothyroidism, unspecified: Secondary | ICD-10-CM | POA: Diagnosis present

## 2013-11-30 DIAGNOSIS — Z87891 Personal history of nicotine dependence: Secondary | ICD-10-CM | POA: Diagnosis not present

## 2013-11-30 DIAGNOSIS — E7800 Pure hypercholesterolemia, unspecified: Secondary | ICD-10-CM | POA: Diagnosis present

## 2013-11-30 DIAGNOSIS — I10 Essential (primary) hypertension: Secondary | ICD-10-CM | POA: Diagnosis present

## 2013-11-30 DIAGNOSIS — J4489 Other specified chronic obstructive pulmonary disease: Secondary | ICD-10-CM | POA: Diagnosis present

## 2013-11-30 HISTORY — PX: TOTAL KNEE ARTHROPLASTY: SHX125

## 2013-11-30 LAB — GLUCOSE, CAPILLARY
GLUCOSE-CAPILLARY: 207 mg/dL — AB (ref 70–99)
Glucose-Capillary: 212 mg/dL — ABNORMAL HIGH (ref 70–99)
Glucose-Capillary: 195 mg/dL — ABNORMAL HIGH (ref 70–99)
Glucose-Capillary: 227 mg/dL — ABNORMAL HIGH (ref 70–99)
Glucose-Capillary: 308 mg/dL — ABNORMAL HIGH (ref 70–99)

## 2013-11-30 LAB — TYPE AND SCREEN
ABO/RH(D): A POS
Antibody Screen: NEGATIVE

## 2013-11-30 SURGERY — ARTHROPLASTY, KNEE, TOTAL
Anesthesia: General | Site: Knee | Laterality: Right

## 2013-11-30 MED ORDER — ACETAMINOPHEN 650 MG RE SUPP: 650.0000 mg | Freq: Four times a day (QID) | RECTAL | Status: DC | PRN

## 2013-11-30 MED ORDER — LIDOCAINE HCL (CARDIAC) 20 MG/ML IV SOLN
INTRAVENOUS | Status: DC | PRN
  Administered 2013-11-30: 60 mg via INTRAVENOUS

## 2013-11-30 MED ORDER — LACTATED RINGERS IV SOLN
INTRAVENOUS | Status: DC
  Administered 2013-11-30: 100 mL/h via INTRAVENOUS
  Administered 2013-12-01 – 2013-12-02 (×2): via INTRAVENOUS

## 2013-11-30 MED ORDER — SUFENTANIL CITRATE 50 MCG/ML IV SOLN
INTRAVENOUS | Status: AC
  Filled 2013-11-30: qty 1

## 2013-11-30 MED ORDER — FENTANYL CITRATE 0.05 MG/ML IJ SOLN
25.0000 ug | INTRAMUSCULAR | Status: DC | PRN
  Administered 2013-11-30: 25 ug via INTRAVENOUS

## 2013-11-30 MED ORDER — PROMETHAZINE HCL 25 MG/ML IJ SOLN
12.5000 mg | Freq: Four times a day (QID) | INTRAMUSCULAR | Status: DC | PRN
  Administered 2013-11-30 – 2013-12-01 (×2): 12.5 mg via INTRAVENOUS
  Filled 2013-11-30 (×3): qty 1

## 2013-11-30 MED ORDER — SODIUM CHLORIDE 0.9 % IN NEBU
3.0000 mL | INHALATION_SOLUTION | RESPIRATORY_TRACT | Status: DC | PRN
  Filled 2013-11-30: qty 3

## 2013-11-30 MED ORDER — BISACODYL 5 MG PO TBEC: 5.0000 mg | DELAYED_RELEASE_TABLET | Freq: Every day | ORAL | Status: DC | PRN

## 2013-11-30 MED ORDER — ALUM & MAG HYDROXIDE-SIMETH 200-200-20 MG/5ML PO SUSP
30.0000 mL | ORAL | Status: DC | PRN
Start: 2013-11-30 — End: 2013-12-03

## 2013-11-30 MED ORDER — PHENYLEPHRINE HCL 10 MG/ML IJ SOLN
10.0000 mg | INTRAMUSCULAR | Status: DC | PRN
  Administered 2013-11-30: 50 ug/min via INTRAVENOUS

## 2013-11-30 MED ORDER — FUROSEMIDE 20 MG PO TABS
20.0000 mg | ORAL_TABLET | Freq: Every day | ORAL | Status: DC
  Administered 2013-11-30 – 2013-12-03 (×4): 20 mg via ORAL
  Filled 2013-11-30 (×4): qty 1

## 2013-11-30 MED ORDER — SODIUM CHLORIDE 0.9 % IJ SOLN
INTRAMUSCULAR | Status: AC
  Filled 2013-11-30: qty 10

## 2013-11-30 MED ORDER — OXYCODONE-ACETAMINOPHEN 5-325 MG PO TABS
1.0000 | ORAL_TABLET | ORAL | Status: DC | PRN
  Administered 2013-12-01 – 2013-12-02 (×4): 1 via ORAL
  Filled 2013-11-30 (×5): qty 1

## 2013-11-30 MED ORDER — HYDROMORPHONE HCL PF 1 MG/ML IJ SOLN: 0.5000 mg | INTRAMUSCULAR | Status: DC | PRN

## 2013-11-30 MED ORDER — SIMVASTATIN 5 MG PO TABS
5.0000 mg | ORAL_TABLET | Freq: Every day | ORAL | Status: DC
  Administered 2013-11-30 – 2013-12-02 (×3): 5 mg via ORAL
  Filled 2013-11-30 (×4): qty 1

## 2013-11-30 MED ORDER — CELECOXIB 200 MG PO CAPS
200.0000 mg | ORAL_CAPSULE | Freq: Two times a day (BID) | ORAL | Status: DC
  Administered 2013-11-30 – 2013-12-03 (×6): 200 mg via ORAL
  Filled 2013-11-30 (×7): qty 1

## 2013-11-30 MED ORDER — PHENYLEPHRINE 40 MCG/ML (10ML) SYRINGE FOR IV PUSH (FOR BLOOD PRESSURE SUPPORT)
PREFILLED_SYRINGE | INTRAVENOUS | Status: AC
  Filled 2013-11-30: qty 10

## 2013-11-30 MED ORDER — FENTANYL CITRATE 0.05 MG/ML IJ SOLN
INTRAMUSCULAR | Status: AC
  Filled 2013-11-30: qty 2

## 2013-11-30 MED ORDER — CEFAZOLIN SODIUM-DEXTROSE 2-3 GM-% IV SOLR
INTRAVENOUS | Status: AC
  Filled 2013-11-30: qty 50

## 2013-11-30 MED ORDER — INSULIN ASPART 100 UNIT/ML ~~LOC~~ SOLN
0.0000 [IU] | Freq: Three times a day (TID) | SUBCUTANEOUS | Status: DC
  Administered 2013-11-30: 5 [IU] via SUBCUTANEOUS
  Administered 2013-12-01: 8 [IU] via SUBCUTANEOUS
  Administered 2013-12-01: 5 [IU] via SUBCUTANEOUS
  Administered 2013-12-01: 15 [IU] via SUBCUTANEOUS
  Administered 2013-12-02: 8 [IU] via SUBCUTANEOUS
  Administered 2013-12-02: 11 [IU] via SUBCUTANEOUS
  Administered 2013-12-02: 5 [IU] via SUBCUTANEOUS
  Administered 2013-12-03: 3 [IU] via SUBCUTANEOUS

## 2013-11-30 MED ORDER — LABETALOL HCL 5 MG/ML IV SOLN
5.0000 mg | INTRAVENOUS | Status: DC | PRN
  Administered 2013-11-30 (×2): 5 mg via INTRAVENOUS

## 2013-11-30 MED ORDER — STERILE WATER FOR IRRIGATION IR SOLN
Status: DC | PRN
  Administered 2013-11-30: 1500 mL

## 2013-11-30 MED ORDER — SODIUM CHLORIDE 0.9 % IR SOLN
Status: AC
  Filled 2013-11-30: qty 1

## 2013-11-30 MED ORDER — CEFAZOLIN SODIUM 1-5 GM-% IV SOLN
1.0000 g | Freq: Four times a day (QID) | INTRAVENOUS | Status: AC
  Administered 2013-11-30 (×2): 1 g via INTRAVENOUS
  Filled 2013-11-30 (×2): qty 50

## 2013-11-30 MED ORDER — PREDNISONE 2.5 MG PO TABS
2.5000 mg | ORAL_TABLET | Freq: Two times a day (BID) | ORAL | Status: DC
  Administered 2013-11-30 – 2013-12-03 (×6): 2.5 mg via ORAL
  Filled 2013-11-30 (×8): qty 1

## 2013-11-30 MED ORDER — BUDESONIDE 0.25 MG/2ML IN SUSP
0.2500 mg | Freq: Every morning | RESPIRATORY_TRACT | Status: DC
  Administered 2013-12-02 – 2013-12-03 (×2): 0.25 mg via RESPIRATORY_TRACT
  Filled 2013-11-30 (×6): qty 2

## 2013-11-30 MED ORDER — BUPIVACAINE LIPOSOME 1.3 % IJ SUSP
20.0000 mL | Freq: Once | INTRAMUSCULAR | Status: DC
  Filled 2013-11-30: qty 20

## 2013-11-30 MED ORDER — ONDANSETRON HCL 4 MG/2ML IJ SOLN
INTRAMUSCULAR | Status: AC
  Filled 2013-11-30: qty 2

## 2013-11-30 MED ORDER — SODIUM CHLORIDE 0.9 % IJ SOLN
INTRAMUSCULAR | Status: DC | PRN
  Administered 2013-11-30: 20 mL

## 2013-11-30 MED ORDER — POLYETHYLENE GLYCOL 3350 17 G PO PACK
17.0000 g | PACK | Freq: Every day | ORAL | Status: DC | PRN
Start: 2013-11-30 — End: 2013-12-03

## 2013-11-30 MED ORDER — PROPOFOL 10 MG/ML IV BOLUS
INTRAVENOUS | Status: AC
  Filled 2013-11-30: qty 20

## 2013-11-30 MED ORDER — OXYCODONE-ACETAMINOPHEN 5-325 MG PO TABS
2.0000 | ORAL_TABLET | ORAL | Status: DC | PRN
  Administered 2013-11-30 (×3): 1 via ORAL
  Filled 2013-11-30 (×4): qty 2

## 2013-11-30 MED ORDER — AMLODIPINE BESYLATE 5 MG PO TABS
5.0000 mg | ORAL_TABLET | Freq: Every morning | ORAL | Status: DC
  Administered 2013-12-01 – 2013-12-03 (×3): 5 mg via ORAL
  Filled 2013-11-30 (×3): qty 1

## 2013-11-30 MED ORDER — LEVOTHYROXINE SODIUM 100 MCG PO TABS
100.0000 ug | ORAL_TABLET | Freq: Every day | ORAL | Status: DC
  Administered 2013-12-01 – 2013-12-03 (×3): 100 ug via ORAL
  Filled 2013-11-30 (×4): qty 1

## 2013-11-30 MED ORDER — FERROUS SULFATE 325 (65 FE) MG PO TABS
325.0000 mg | ORAL_TABLET | Freq: Three times a day (TID) | ORAL | Status: DC
  Administered 2013-12-01 – 2013-12-03 (×3): 325 mg via ORAL
  Filled 2013-11-30 (×11): qty 1

## 2013-11-30 MED ORDER — PROMETHAZINE HCL 25 MG/ML IJ SOLN
6.2500 mg | INTRAMUSCULAR | Status: DC | PRN
  Administered 2013-11-30: 6.25 mg via INTRAVENOUS

## 2013-11-30 MED ORDER — ONDANSETRON HCL 4 MG PO TABS: 4.0000 mg | ORAL_TABLET | Freq: Four times a day (QID) | ORAL | Status: DC | PRN

## 2013-11-30 MED ORDER — CHLORHEXIDINE GLUCONATE 4 % EX LIQD
60.0000 mL | Freq: Once | CUTANEOUS | Status: DC
Start: 2013-11-30 — End: 2013-11-30

## 2013-11-30 MED ORDER — LIDOCAINE HCL (CARDIAC) 20 MG/ML IV SOLN
INTRAVENOUS | Status: AC
  Filled 2013-11-30: qty 5

## 2013-11-30 MED ORDER — TRANEXAMIC ACID 100 MG/ML IV SOLN
1000.0000 mg | INTRAVENOUS | Status: AC
  Administered 2013-11-30: 1000 mg via INTRAVENOUS
  Filled 2013-11-30: qty 10

## 2013-11-30 MED ORDER — THROMBIN 5000 UNITS EX SOLR
CUTANEOUS | Status: AC
  Filled 2013-11-30: qty 5000

## 2013-11-30 MED ORDER — HYDROCODONE-ACETAMINOPHEN 5-325 MG PO TABS: 1.0000 | ORAL_TABLET | ORAL | Status: DC | PRN

## 2013-11-30 MED ORDER — FLEET ENEMA 7-19 GM/118ML RE ENEM: 1.0000 | ENEMA | Freq: Once | RECTAL | Status: AC | PRN

## 2013-11-30 MED ORDER — HYDROMORPHONE HCL PF 2 MG/ML IJ SOLN
INTRAMUSCULAR | Status: AC
  Filled 2013-11-30: qty 1

## 2013-11-30 MED ORDER — PROPOFOL 10 MG/ML IV BOLUS
INTRAVENOUS | Status: DC | PRN
  Administered 2013-11-30: 150 mg via INTRAVENOUS
  Administered 2013-11-30: 30 mg via INTRAVENOUS

## 2013-11-30 MED ORDER — CHLORHEXIDINE GLUCONATE 4 % EX LIQD: 60.0000 mL | Freq: Once | CUTANEOUS | Status: DC

## 2013-11-30 MED ORDER — LABETALOL HCL 300 MG PO TABS
300.0000 mg | ORAL_TABLET | Freq: Two times a day (BID) | ORAL | Status: DC
  Administered 2013-11-30 – 2013-12-03 (×6): 300 mg via ORAL
  Filled 2013-11-30 (×8): qty 1

## 2013-11-30 MED ORDER — THROMBIN 5000 UNITS EX SOLR
CUTANEOUS | Status: DC | PRN
  Administered 2013-11-30: 5000 [IU] via TOPICAL

## 2013-11-30 MED ORDER — SODIUM CHLORIDE 0.9 % IR SOLN
Status: DC | PRN
  Administered 2013-11-30: 12:00:00

## 2013-11-30 MED ORDER — ONDANSETRON HCL 4 MG/2ML IJ SOLN
INTRAMUSCULAR | Status: DC | PRN
  Administered 2013-11-30: 4 mg via INTRAVENOUS

## 2013-11-30 MED ORDER — ACETAMINOPHEN 325 MG PO TABS
650.0000 mg | ORAL_TABLET | Freq: Four times a day (QID) | ORAL | Status: DC | PRN
  Administered 2013-12-03: 650 mg via ORAL
  Filled 2013-11-30: qty 2

## 2013-11-30 MED ORDER — GLIPIZIDE ER 10 MG PO TB24
10.0000 mg | ORAL_TABLET | Freq: Every evening | ORAL | Status: DC
  Administered 2013-11-30 – 2013-12-02 (×3): 10 mg via ORAL
  Filled 2013-11-30 (×4): qty 1

## 2013-11-30 MED ORDER — MENTHOL 3 MG MT LOZG: 1.0000 | LOZENGE | OROMUCOSAL | Status: DC | PRN

## 2013-11-30 MED ORDER — INSULIN ASPART 100 UNIT/ML ~~LOC~~ SOLN
5.0000 [IU] | Freq: Once | SUBCUTANEOUS | Status: AC
  Administered 2013-11-30: 23:00:00 via SUBCUTANEOUS

## 2013-11-30 MED ORDER — LABETALOL HCL 5 MG/ML IV SOLN
INTRAVENOUS | Status: AC
  Filled 2013-11-30: qty 4

## 2013-11-30 MED ORDER — METHOCARBAMOL 500 MG PO TABS
500.0000 mg | ORAL_TABLET | Freq: Four times a day (QID) | ORAL | Status: DC | PRN
Start: 2013-11-30 — End: 2013-12-03
  Administered 2013-11-30 – 2013-12-03 (×6): 500 mg via ORAL
  Filled 2013-11-30 (×6): qty 1

## 2013-11-30 MED ORDER — LACTATED RINGERS IV SOLN
INTRAVENOUS | Status: DC
  Administered 2013-11-30: 13:00:00 via INTRAVENOUS
  Administered 2013-11-30: 1000 mL via INTRAVENOUS

## 2013-11-30 MED ORDER — METFORMIN HCL 500 MG PO TABS
1000.0000 mg | ORAL_TABLET | Freq: Two times a day (BID) | ORAL | Status: DC
  Administered 2013-12-02: 1000 mg via ORAL
  Filled 2013-11-30 (×6): qty 2

## 2013-11-30 MED ORDER — CLONIDINE HCL 0.1 MG PO TABS
0.1000 mg | ORAL_TABLET | Freq: Every day | ORAL | Status: DC
  Administered 2013-11-30 – 2013-12-02 (×3): 0.1 mg via ORAL
  Filled 2013-11-30 (×5): qty 1

## 2013-11-30 MED ORDER — BENAZEPRIL HCL 40 MG PO TABS
40.0000 mg | ORAL_TABLET | Freq: Two times a day (BID) | ORAL | Status: DC
  Administered 2013-11-30 – 2013-12-02 (×4): 40 mg via ORAL
  Filled 2013-11-30 (×5): qty 1

## 2013-11-30 MED ORDER — BUPIVACAINE LIPOSOME 1.3 % IJ SUSP
INTRAMUSCULAR | Status: DC | PRN
  Administered 2013-11-30: 20 mL

## 2013-11-30 MED ORDER — CISATRACURIUM BESYLATE 20 MG/10ML IV SOLN
INTRAVENOUS | Status: AC
  Filled 2013-11-30: qty 10

## 2013-11-30 MED ORDER — SODIUM CHLORIDE 0.9 % IJ SOLN
INTRAMUSCULAR | Status: AC
  Filled 2013-11-30: qty 20

## 2013-11-30 MED ORDER — HYDROMORPHONE HCL PF 1 MG/ML IJ SOLN
INTRAMUSCULAR | Status: DC | PRN
  Administered 2013-11-30: .4 mg via INTRAVENOUS
  Administered 2013-11-30: .2 mg via INTRAVENOUS

## 2013-11-30 MED ORDER — RIVAROXABAN 10 MG PO TABS
10.0000 mg | ORAL_TABLET | Freq: Every day | ORAL | Status: DC
  Administered 2013-12-01 – 2013-12-03 (×3): 10 mg via ORAL
  Filled 2013-11-30 (×4): qty 1

## 2013-11-30 MED ORDER — PROMETHAZINE HCL 25 MG/ML IJ SOLN
INTRAMUSCULAR | Status: AC
  Administered 2013-11-30: 12.5 mg via INTRAVENOUS
  Filled 2013-11-30: qty 1

## 2013-11-30 MED ORDER — SUFENTANIL CITRATE 50 MCG/ML IV SOLN
INTRAVENOUS | Status: DC | PRN
  Administered 2013-11-30: 20 ug via INTRAVENOUS
  Administered 2013-11-30 (×3): 10 ug via INTRAVENOUS

## 2013-11-30 MED ORDER — DEXTROSE 5 % IV SOLN
500.0000 mg | Freq: Four times a day (QID) | INTRAVENOUS | Status: DC | PRN
  Administered 2013-11-30: 500 mg via INTRAVENOUS
  Filled 2013-11-30: qty 5

## 2013-11-30 MED ORDER — PHENOL 1.4 % MT LIQD: 1.0000 | OROMUCOSAL | Status: DC | PRN

## 2013-11-30 MED ORDER — ONDANSETRON HCL 4 MG/2ML IJ SOLN: 4.0000 mg | Freq: Four times a day (QID) | INTRAMUSCULAR | Status: DC | PRN

## 2013-11-30 MED ORDER — ALBUTEROL SULFATE (2.5 MG/3ML) 0.083% IN NEBU: 2.5000 mg | INHALATION_SOLUTION | Freq: Four times a day (QID) | RESPIRATORY_TRACT | Status: DC | PRN

## 2013-11-30 MED ORDER — CEFAZOLIN SODIUM-DEXTROSE 2-3 GM-% IV SOLR
2.0000 g | INTRAVENOUS | Status: AC
  Administered 2013-11-30: 2 g via INTRAVENOUS

## 2013-11-30 SURGICAL SUPPLY — 72 items
BAG ZIPLOCK 12X15 (MISCELLANEOUS) IMPLANT
BANDAGE ELASTIC 4 VELCRO ST LF (GAUZE/BANDAGES/DRESSINGS) ×3 IMPLANT
BANDAGE ELASTIC 6 VELCRO ST LF (GAUZE/BANDAGES/DRESSINGS) ×3 IMPLANT
BANDAGE ESMARK 6X9 LF (GAUZE/BANDAGES/DRESSINGS) ×1 IMPLANT
BLADE SAG 18X100X1.27 (BLADE) ×3 IMPLANT
BLADE SAW SGTL 11.0X1.19X90.0M (BLADE) ×3 IMPLANT
BNDG ESMARK 6X9 LF (GAUZE/BANDAGES/DRESSINGS) ×3
BONE CEMENT GENTAMICIN (Cement) ×6 IMPLANT
CAPT RP KNEE ×3 IMPLANT
CEMENT BONE GENTAMICIN 40 (Cement) ×2 IMPLANT
CUFF TOURN SGL QUICK 34 (TOURNIQUET CUFF) ×2
CUFF TRNQT CYL 34X4X40X1 (TOURNIQUET CUFF) ×1 IMPLANT
DERMABOND ADVANCED (GAUZE/BANDAGES/DRESSINGS) ×4
DERMABOND ADVANCED .7 DNX12 (GAUZE/BANDAGES/DRESSINGS) ×2 IMPLANT
DRAPE EXTREMITY T 121X128X90 (DRAPE) ×3 IMPLANT
DRAPE INCISE IOBAN 66X45 STRL (DRAPES) ×3 IMPLANT
DRAPE POUCH INSTRU U-SHP 10X18 (DRAPES) ×3 IMPLANT
DRAPE U-SHAPE 47X51 STRL (DRAPES) ×3 IMPLANT
DRSG AQUACEL AG ADV 3.5X10 (GAUZE/BANDAGES/DRESSINGS) ×6 IMPLANT
DRSG EMULSION OIL 3X16 NADH (GAUZE/BANDAGES/DRESSINGS) IMPLANT
DRSG PAD ABDOMINAL 8X10 ST (GAUZE/BANDAGES/DRESSINGS) IMPLANT
DRSG TEGADERM 4X4.75 (GAUZE/BANDAGES/DRESSINGS) ×6 IMPLANT
DURAPREP 26ML APPLICATOR (WOUND CARE) ×3 IMPLANT
ELECT REM PT RETURN 9FT ADLT (ELECTROSURGICAL) ×3
ELECTRODE REM PT RTRN 9FT ADLT (ELECTROSURGICAL) ×1 IMPLANT
EVACUATOR 1/8 PVC DRAIN (DRAIN) ×3 IMPLANT
FACESHIELD WRAPAROUND (MASK) ×18 IMPLANT
GAUZE SPONGE 2X2 8PLY STRL LF (GAUZE/BANDAGES/DRESSINGS) ×2 IMPLANT
GAUZE SPONGE 4X4 16PLY XRAY LF (GAUZE/BANDAGES/DRESSINGS) ×3 IMPLANT
GLOVE BIO SURGEON STRL SZ7.5 (GLOVE) ×3 IMPLANT
GLOVE BIO SURGEON STRL SZ8 (GLOVE) ×9 IMPLANT
GLOVE BIOGEL PI IND STRL 6.5 (GLOVE) ×1 IMPLANT
GLOVE BIOGEL PI IND STRL 7.5 (GLOVE) ×1 IMPLANT
GLOVE BIOGEL PI IND STRL 8 (GLOVE) ×2 IMPLANT
GLOVE BIOGEL PI INDICATOR 6.5 (GLOVE) ×2
GLOVE BIOGEL PI INDICATOR 7.5 (GLOVE) ×2
GLOVE BIOGEL PI INDICATOR 8 (GLOVE) ×4
GLOVE ECLIPSE 8.0 STRL XLNG CF (GLOVE) ×6 IMPLANT
GLOVE SURG SS PI 6.5 STRL IVOR (GLOVE) ×6 IMPLANT
GOWN STRL REUS W/TWL LRG LVL3 (GOWN DISPOSABLE) ×3 IMPLANT
GOWN STRL REUS W/TWL XL LVL3 (GOWN DISPOSABLE) ×3 IMPLANT
HANDPIECE INTERPULSE COAX TIP (DISPOSABLE) ×2
IMMOBILIZER KNEE 20 (SOFTGOODS) ×6 IMPLANT
IMMOBILIZER KNEE 20 THIGH 36 (SOFTGOODS) ×1 IMPLANT
KIT BASIN OR (CUSTOM PROCEDURE TRAY) ×3 IMPLANT
MANIFOLD NEPTUNE II (INSTRUMENTS) ×3 IMPLANT
NEEDLE HYPO 22GX1.5 SAFETY (NEEDLE) ×3 IMPLANT
NS IRRIG 1000ML POUR BTL (IV SOLUTION) IMPLANT
PACK TOTAL JOINT (CUSTOM PROCEDURE TRAY) ×3 IMPLANT
PADDING CAST COTTON 6X4 STRL (CAST SUPPLIES) IMPLANT
POSITIONER SURGICAL ARM (MISCELLANEOUS) ×3 IMPLANT
SET HNDPC FAN SPRY TIP SCT (DISPOSABLE) ×1 IMPLANT
SET PAD KNEE POSITIONER (MISCELLANEOUS) ×3 IMPLANT
SPONGE GAUZE 2X2 STER 10/PKG (GAUZE/BANDAGES/DRESSINGS) ×4
SPONGE LAP 18X18 X RAY DECT (DISPOSABLE) IMPLANT
SPONGE SURGIFOAM ABS GEL 100 (HEMOSTASIS) ×3 IMPLANT
STAPLER VISISTAT 35W (STAPLE) IMPLANT
SUCTION FRAZIER 12FR DISP (SUCTIONS) ×3 IMPLANT
SUT BONE WAX W31G (SUTURE) ×3 IMPLANT
SUT MNCRL AB 4-0 PS2 18 (SUTURE) ×3 IMPLANT
SUT VIC AB 1 CT1 27 (SUTURE) ×4
SUT VIC AB 1 CT1 27XBRD ANTBC (SUTURE) ×2 IMPLANT
SUT VIC AB 2-0 CT1 27 (SUTURE) ×6
SUT VIC AB 2-0 CT1 TAPERPNT 27 (SUTURE) ×3 IMPLANT
SUT VLOC 180 0 24IN GS25 (SUTURE) ×3 IMPLANT
SYR 20CC LL (SYRINGE) ×3 IMPLANT
TOWEL OR 17X26 10 PK STRL BLUE (TOWEL DISPOSABLE) ×3 IMPLANT
TOWEL OR NON WOVEN STRL DISP B (DISPOSABLE) IMPLANT
TOWER CARTRIDGE SMART MIX (DISPOSABLE) ×3 IMPLANT
TRAY FOLEY CATH 14FRSI W/METER (CATHETERS) ×3 IMPLANT
WATER STERILE IRR 1500ML POUR (IV SOLUTION) ×3 IMPLANT
WRAP KNEE MAXI GEL POST OP (GAUZE/BANDAGES/DRESSINGS) ×3 IMPLANT

## 2013-11-30 NOTE — Anesthesia Postprocedure Evaluation (Signed)
  Anesthesia Post-op Note  Patient: Ryan Salinas  Procedure(s) Performed: Procedure(s) (LRB): RIGHT TOTAL KNEE ARTHROPLASTY (Right)  Patient Location: PACU  Anesthesia Type: General  Level of Consciousness: awake and alert   Airway and Oxygen Therapy: Patient Spontanous Breathing  Post-op Pain: mild  Post-op Assessment: Post-op Vital signs reviewed, Patient's Cardiovascular Status Stable, Respiratory Function Stable, Patent Airway and No signs of Nausea or vomiting  Last Vitals:  Filed Vitals:   11/30/13 0849  BP: 155/81  Pulse: 73  Temp: 36.6 C  Resp: 16    Post-op Vital Signs: stable   Complications: No apparent anesthesia complications

## 2013-11-30 NOTE — Transfer of Care (Signed)
Immediate Anesthesia Transfer of Care Note  Patient: Ryan Salinas  Procedure(s) Performed: Procedure(s): RIGHT TOTAL KNEE ARTHROPLASTY (Right)  Patient Location: PACU  Anesthesia Type:General  Level of Consciousness: awake  Airway & Oxygen Therapy: Patient Spontanous Breathing and Patient connected to tracheostomy mask oxygen  Post-op Assessment: Report given to PACU RN and Post -op Vital signs reviewed and stable  Post vital signs: Reviewed and stable  Complications: No apparent anesthesia complications

## 2013-11-30 NOTE — Progress Notes (Signed)
Utilization review completed.  

## 2013-11-30 NOTE — Addendum Note (Signed)
Addendum created 11/30/13 1423 by Milana Obey, MD   Modules edited: Orders, PRL Based Order Sets

## 2013-11-30 NOTE — Interval H&P Note (Signed)
History and Physical Interval Note:  11/30/2013 11:04 AM  Ryan Salinas  has presented today for surgery, with the diagnosis of osteoarthritis of the right knee  The various methods of treatment have been discussed with the patient and family. After consideration of risks, benefits and other options for treatment, the patient has consented to  Procedure(s): RIGHT TOTAL KNEE ARTHROPLASTY (Right) as a surgical intervention .  The patient's history has been reviewed, patient examined, no change in status, stable for surgery.  I have reviewed the patient's chart and labs.  Questions were answered to the patient's satisfaction.     Tarissa Kerin A

## 2013-11-30 NOTE — Op Note (Signed)
NAMEHERNY, Ryan Salinas NO.:  [id number]  MEDICAL RECORD NO.:  [id number]  LOCATION:  35                         FACILITY:  [facility]  PHYSICIAN:  Kipp Brood. Sicilia Killough, M.D.DATE OF BIRTH:  [birth year]  DATE OF PROCEDURE:  11/30/2013 DATE OF DISCHARGE:                              OPERATIVE REPORT   SURGEON:  [name], [name]  ASSISTANT:  Ardeen Jourdain, PA-C  PREOPERATIVE DIAGNOSES: 1. Flexion contracture, right knee. 2. Severe bone-on-bone osteoarthritis of the medial compartment with     collapse of the medial space, right knee. 3. Degenerative arthritis.  OPERATION: 1. Right total knee arthroplasty utilizing the DePuy system, all 3     components were cemented.  Gentamicin was used in the cement.  The     sizes used was a size 4 right femur femoral component posterior     cruciate sacrificing type.  The tray was a size 5 tray.  The insert     was a size 4, 12.5 mm thickness with a rotating platform and     patella was a size 38 with 3 PEG.  DESCRIPTION OF PROCEDURE:  Under general anesthesia, routine orthopedic prepping and draping of the right lower extremity was carried out.  The appropriate time-out was first carried out.  I also marked the appropriate right leg in the holding area.  The patient had 2 g of IV Ancef prior to surgery.  At this time, the leg was exsanguinated and Esmarch tourniquet was elevated at 325 mmHg.  The knee was flexed and the knee was placed in a [employer] knee holder.  An anterior approach of the knee was carried out.  Two flaps were created.  I then carried out the median parapatellar incision in usual fashion.  At this particular time, I then did medial and lateral meniscectomies and excised the anterior posterior cruciate ligaments.  I then noted that the drill hole then was made in the intercondylar notch.  I removed 14 mm thickness off the distal femur because of the contracture and extreme tightness of the knee.  Also,  at this point, after the distal femoral cut was made, we then measured the femur to be a size 4.  We then made our appropriate cut for a size 4 right femoral component.  After this, we then went on and prepared our tibia in the usual fashion.  We removed approximately 8 mm thickness off the medial side utilizing the medial side of the tibia as our guide and used the intramedullary guide rod as well.  After this was done, we inserted our lamina spreaders and made sure there were no posterior spurs, there were not.  We then inserted our spacer guide blocks and had an excellent stability medial and lateral with good flexion and extension with a spacer block, measuring 12.5 mm.  We then went on proceeded to continue our cut of the tibia.  We made our keel cut in the proximal of the tibial plateau in the usual fashion.  We then did our notch cut in the distal femur.  Trial components were inserted. We first tried a 10 mm thickness insert, finally went to a  12.5 mm thickness insert with good stability and good motion.  I then did a resurfacing procedure on the patella.  We removed the appropriate amount of bone.  Three drill holes were made in the patella articular surface for a size 38 patella.  All trial components were removed.  We thoroughly water picked out the knee, cemented all 3 components in simultaneously.  Once the cement was hardened, we removed loose pieces of cement.  I then water picked out the knee, to make sure there were no other loose pieces of cement present.  We inspected the posterior aspect of the knee as well for loose cement.  I then inserted some thrombin-soaked Gelfoam.  I then inserted my permanent rotating platform 12.5 mm thickness size 4 in the usual fashion.  We then inserted a Hemovac drain, reduced the knee and then closed the wound layers in usual fashion.          ______________________________ [name], [name]     RAG/MEDQ  D:  11/30/2013  T:   11/30/2013  Job:  [id number]

## 2013-11-30 NOTE — Anesthesia Preprocedure Evaluation (Signed)
Anesthesia Evaluation    Airway Mallampati: III TM Distance: >3 FB Neck ROM: Full  Mouth opening: Limited Mouth Opening  Dental  (+) Edentulous Upper, Edentulous Lower   Pulmonary shortness of breath, pneumonia -, resolved, COPDformer smoker,    Pulmonary exam normal       Cardiovascular hypertension, + dysrhythmias Rhythm:Regular Rate:Normal     Neuro/Psych    GI/Hepatic   Endo/Other  diabetesHypothyroidism   Renal/GU      Musculoskeletal  (+) Arthritis -, Osteoarthritis,    Abdominal Normal abdominal exam  (+)   Peds  Hematology   Anesthesia Other Findings Tracheostomy, mature  Reproductive/Obstetrics                           Anesthesia Physical Anesthesia Plan  ASA: III  Anesthesia Plan: General   Post-op Pain Management:    Induction: Intravenous  Airway Management Planned: Oral ETT  Additional Equipment:   Intra-op Plan:   Post-operative Plan: Extubation in OR  Informed Consent: I have reviewed the patients History and Physical, chart, labs and discussed the procedure including the risks, benefits and alternatives for the proposed anesthesia with the patient or authorized representative who has indicated his/her understanding and acceptance.   Dental advisory given  Plan Discussed with: CRNA  Anesthesia Plan Comments:         Anesthesia Quick Evaluation

## 2013-11-30 NOTE — Brief Op Note (Signed)
11/30/2013  1:17 PM  PATIENT:  [name]  79 y.o. male  PRE-OPERATIVE DIAGNOSIS:  osteoarthritis of the right knee with Flexion Contracture  POST-OPERATIVE DIAGNOSIS:  osteoarthritis of the right knee with Flexion Contracture  PROCEDURE:  Procedure(s): RIGHT TOTAL KNEE ARTHROPLASTY (Right) and release of Contractures.  SURGEON:  Surgeon(s) and Role:    * Tobi Bastos, MD - Primary  PHYSICIAN ASSISTANT: Ardeen Jourdain PA  ASSISTANTS: Ardeen Jourdain PA  ANESTHESIA:   general  EBL:  Total I/O In: 1000 [I.V.:1000] Out: 175 [Urine:150; Blood:25]  BLOOD ADMINISTERED:none  DRAINS: (one) Hemovact drain(s) in the Right Knee with  Suction Open   LOCAL MEDICATIONS USED:  BUPIVICAINE 20cc mixed with 20cc of Normal Saline  SPECIMEN:  No Specimen  DISPOSITION OF SPECIMEN:  N/A  COUNTS:  YES  TOURNIQUET:  * Missing tourniquet times found for documented tourniquets in log:  [id number] *  DICTATION: .Other Dictation: Dictation Number [id number]  PLAN OF CARE: Admit to inpatient   PATIENT DISPOSITION:  Stable in OR   Delay start of Pharmacological VTE agent (>24hrs) due to surgical blood loss or risk of bleeding: yes

## 2013-12-01 ENCOUNTER — Encounter (HOSPITAL_COMMUNITY): Payer: Self-pay | Admitting: Orthopedic Surgery

## 2013-12-01 LAB — BASIC METABOLIC PANEL
ANION GAP: 13 (ref 5–15)
BUN: 17 mg/dL (ref 6–23)
CALCIUM: 8.3 mg/dL — AB (ref 8.4–10.5)
CHLORIDE: 100 meq/L (ref 96–112)
CO2: 25 meq/L (ref 19–32)
CREATININE: 1.15 mg/dL (ref 0.50–1.35)
GFR calc Af Amer: 69 mL/min — ABNORMAL LOW (ref >90)
GFR calc non Af Amer: 60 mL/min — ABNORMAL LOW (ref >90)
GLUCOSE: 234 mg/dL — AB (ref 70–99)
Potassium: 3.3 mEq/L — ABNORMAL LOW (ref 3.7–5.3)
Sodium: 138 mEq/L (ref 137–147)

## 2013-12-01 LAB — CBC
HEMATOCRIT: 34.5 % — AB (ref 39.0–52.0)
Hemoglobin: 11.7 g/dL — ABNORMAL LOW (ref 13.0–17.0)
MCH: 27.7 pg (ref 26.0–34.0)
MCHC: 33.9 g/dL (ref 30.0–36.0)
MCV: 81.8 fL (ref 78.0–100.0)
PLATELETS: 195 10*3/uL (ref 150–400)
RBC: 4.22 MIL/uL (ref 4.22–5.81)
RDW: 14.2 % (ref 11.5–15.5)
WBC: 12 10*3/uL — AB (ref 4.0–10.5)

## 2013-12-01 LAB — GLUCOSE, CAPILLARY
GLUCOSE-CAPILLARY: 197 mg/dL — AB (ref 70–99)
GLUCOSE-CAPILLARY: 356 mg/dL — AB (ref 70–99)
Glucose-Capillary: 237 mg/dL — ABNORMAL HIGH (ref 70–99)
Glucose-Capillary: 288 mg/dL — ABNORMAL HIGH (ref 70–99)
Glucose-Capillary: 304 mg/dL — ABNORMAL HIGH (ref 70–99)

## 2013-12-01 MED ORDER — INSULIN ASPART 100 UNIT/ML ~~LOC~~ SOLN
0.0000 [IU] | Freq: Every day | SUBCUTANEOUS | Status: DC
Start: 2013-12-01 — End: 2013-12-03
  Administered 2013-12-01: 4 [IU] via SUBCUTANEOUS
  Administered 2013-12-02: 3 [IU] via SUBCUTANEOUS

## 2013-12-01 MED ORDER — POTASSIUM CHLORIDE CRYS ER 20 MEQ PO TBCR
30.0000 meq | EXTENDED_RELEASE_TABLET | Freq: Two times a day (BID) | ORAL | Status: DC
  Administered 2013-12-01 – 2013-12-03 (×5): 30 meq via ORAL
  Filled 2013-12-01 (×6): qty 1

## 2013-12-01 MED ORDER — ALBUTEROL SULFATE (2.5 MG/3ML) 0.083% IN NEBU
2.5000 mg | INHALATION_SOLUTION | Freq: Three times a day (TID) | RESPIRATORY_TRACT | Status: DC
  Administered 2013-12-01 – 2013-12-03 (×4): 2.5 mg via RESPIRATORY_TRACT
  Filled 2013-12-01 (×6): qty 3

## 2013-12-01 MED ORDER — SODIUM CHLORIDE 0.9 % IV BOLUS (SEPSIS)
500.0000 mL | Freq: Once | INTRAVENOUS | Status: AC
  Administered 2013-12-01: 500 mL via INTRAVENOUS

## 2013-12-01 NOTE — Progress Notes (Signed)
RT checked patients stoma. There is no redness or drainage. Patient encouraged to let RN know if he needs Respirtory. RT will monitor and assess as needed.

## 2013-12-01 NOTE — Discharge Instructions (Addendum)
Information on my medicine - XARELTO (Rivaroxaban)  This medication education was reviewed with me or my healthcare representative as part of my discharge preparation.  The pharmacist that spoke with me during my hospital stay was:  Clovis Riley, [facility]  Why was Xarelto prescribed for you? Xarelto was prescribed for you to reduce the risk of blood clots forming after orthopedic surgery. The medical term for these abnormal blood clots is venous thromboembolism (VTE).  What do you need to know about xarelto ? Take your Xarelto ONCE DAILY at the same time every day. You may take it either with or without food.  If you have difficulty swallowing the tablet whole, you may crush it and mix in applesauce just prior to taking your dose.  Take Xarelto exactly as prescribed by your doctor and DO NOT stop taking Xarelto without talking to the doctor who prescribed the medication.  Stopping without other VTE prevention medication to take the place of Xarelto may increase your risk of developing a clot.  After discharge, you should have regular check-up appointments with your healthcare provider that is prescribing your Xarelto.    What do you do if you miss a dose? If you miss a dose, take it as soon as you remember on the same day then continue your regularly scheduled once daily regimen the next day. Do not take two doses of Xarelto on the same day.   Important Safety Information A possible side effect of Xarelto is bleeding. You should call your healthcare provider right away if you experience any of the following:   Bleeding from an injury or your nose that does not stop.   Unusual colored urine (red or dark brown) or unusual colored stools (red or black).   Unusual bruising for unknown reasons.   A serious fall or if you hit your head (even if there is no bleeding).  Some medicines may interact with Xarelto and might increase your risk of bleeding while on Xarelto. To help  avoid this, consult your healthcare provider or pharmacist prior to using any new prescription or non-prescription medications, including herbals, vitamins, non-steroidal anti-inflammatory drugs (NSAIDs) and supplements.  This website has more information on Xarelto: [email or web address].[email or web address]   Walk with your walker. Weight bearing as tolerated [place] will follow you at home for your therapy  Do not change your dressing over the incision unless there is excess drainage Change dressing over drain site Shower only, no tub bath. Call if any temperatures greater than 101 or any wound complications: [phone] during the day and ask for Dr. Charlestine Night nurse, Brunilda Payor.

## 2013-12-01 NOTE — Care Management Note (Signed)
    Page 1 of 1   12/01/2013     11:06:44 AM CARE MANAGEMENT NOTE 12/01/2013  Patient:  Ryan Salinas,Ryan Salinas   Account Number:  [id number]  Date Initiated:  12/01/2013  Documentation initiated by:  [facility]  Subjective/Objective Assessment:   adm: RIGHT TOTAL KNEE ARTHROPLASTY (Right)     Action/Plan:   discharge planning   Anticipated DC Date:  12/01/2013   Anticipated DC Plan:  [place]  CM consult      [facility] Choice  HOME HEALTH   Choice offered to / List presented to:  C-1 Patient        [place] arranged  [place] [facility]      [place].   Status of service:  Completed, signed off Medicare Important Message given?   (If response is "NO", the following Medicare IM given date fields will be blank) Date Medicare IM given:   Medicare IM given by:   Date Additional Medicare IM given:   Additional Medicare IM given by:    Discharge Disposition:  [place]  Per UR Regulation:    If discussed at Long Length of Stay Meetings, dates discussed:    Comments:  12/01/13 10:59 CM met with pt in room to confirm choice of home health agency.  Pt states he wants [facility] to render his HHPT.  Address and contact information verified with pt. No DME needed.  Referral texted to [facility] rep, Kristen.  No other CM needs were communicated.  Mariane Masters, BSN, CM [phone].

## 2013-12-01 NOTE — Progress Notes (Signed)
Physical Therapy Treatment Patient Details Name: Raymont Andreoni MRN: [id number] DOB: [birth year] Today's Date: 12/29/2013    History of Present Illness R TKR; trach in place    PT Comments    Progressing slowly with limited endurance and moderate SOB with limited exertion.  Follow Up Recommendations  Home health PT     Equipment Recommendations  Rolling walker with 5" wheels    Recommendations for Other Services OT consult     Precautions / Restrictions Precautions Precautions: Knee;Fall Required Braces or Orthoses: Knee Immobilizer - Right Knee Immobilizer - Right: Discontinue once straight leg raise with < 10 degree lag Restrictions Weight Bearing Restrictions: No Other Position/Activity Restrictions: WBAT    Mobility  Bed Mobility Overal bed mobility: Needs Assistance Bed Mobility: Sit to Supine       Sit to supine: Mod assist   General bed mobility comments: cues for sequence and use of L LE to self assist.  Pt assisted to EOB with pad  Transfers Overall transfer level: Needs assistance Equipment used: Rolling walker (2 wheeled) Transfers: Sit to/from Stand Sit to Stand: Mod assist         General transfer comment: cues for LE management and use of UEs to self assist  Ambulation/Gait Ambulation/Gait assistance: Mod assist;+2 physical assistance;+2 safety/equipment Ambulation Distance (Feet): 18 Feet Assistive device: Rolling walker (2 wheeled) Gait Pattern/deviations: Step-to pattern;Decreased step length - right;Decreased step length - left;Shuffle;Trunk flexed Gait velocity: decreased with multiple rest breaks 2* SOB/fatigue   General Gait Details: cues for posture, sequence and position from RW;    Stairs            Wheelchair Mobility    Modified Rankin (Stroke Patients Only)       Balance                                    Cognition Arousal/Alertness: Awake/alert Behavior During Therapy: WFL for tasks  assessed/performed Overall Cognitive Status: Within Functional Limits for tasks assessed                      Exercises      General Comments        Pertinent Vitals/Pain Pain Assessment: 0-10 Pain Score: 5  Pain Location: R knee Pain Descriptors / Indicators: Aching;Sore Pain Intervention(s): Limited activity within patient's tolerance;Monitored during session;Premedicated before session;Ice applied    Home Living                      Prior Function            PT Goals (current goals can now be found in the care plan section) Acute Rehab PT Goals Patient Stated Goal: Resume previous lifestyle with decreased pain PT Goal Formulation: With patient Time For Goal Achievement: 12/08/13 Potential to Achieve Goals: Good Progress towards PT goals: Progressing toward goals    Frequency  7X/week    PT Plan Current plan remains appropriate    Co-evaluation             End of Session           Time: 8786-7672 PT Time Calculation (min): 25 min  Charges:  $Gait Training: 23-37 mins                    G Codes:      Lester Crickenberger 12-29-13, 5:25 PM

## 2013-12-01 NOTE — Evaluation (Signed)
Physical Therapy Evaluation Patient Details Name: Ryan Salinas MRN: [id number] DOB: 16-Jul-1934 Today's Date: 12/01/2013   History of Present Illness  R TKR; trach in place  Clinical Impression  Pt s/p R TKR presents with decreased R LE strength/ROM and post op pain limiting functional mobility.  Pt should progress to d/c home with family assist and HHPT follow up.    Follow Up Recommendations Home health PT    Equipment Recommendations       Recommendations for Other Services OT consult     Precautions / Restrictions Precautions Precautions: Knee;Fall Required Braces or Orthoses: Knee Immobilizer - Right Knee Immobilizer - Right: Discontinue once straight leg raise with < 10 degree lag Restrictions Weight Bearing Restrictions: No Other Position/Activity Restrictions: WBAT      Mobility  Bed Mobility Overal bed mobility: Needs Assistance Bed Mobility: Supine to Sit Rolling: Mod assist;+2 for physical assistance         General bed mobility comments: cues for sequence and use of L LE to self assist.  Pt assisted to EOB with pad  Transfers Overall transfer level: Needs assistance Equipment used: Rolling walker (2 wheeled) Transfers: Sit to/from Stand Sit to Stand: Mod assist         General transfer comment: cues for LE management and use of UEs to self assist  Ambulation/Gait Ambulation/Gait assistance: Mod assist;+2 safety/equipment Ambulation Distance (Feet): 6 Feet (and 3') Assistive device: Rolling walker (2 wheeled) Gait Pattern/deviations: Step-to pattern;Decreased step length - right;Decreased step length - left;Shuffle;Antalgic;Trunk flexed Gait velocity: decr   General Gait Details: cues for posture, sequence and position from RW; pt initially tolerating min wt on R LE  Stairs            Wheelchair Mobility    Modified Rankin (Stroke Patients Only)       Balance                                              Pertinent Vitals/Pain Pain Assessment: 0-10 Pain Score: 5  Pain Location: R knee Pain Descriptors / Indicators: Aching;Sore Pain Intervention(s): Limited activity within patient's tolerance;Monitored during session;Premedicated before session;Ice applied    Home Living Family/patient expects to be discharged to:: Private residence Living Arrangements: Spouse/significant other Available Help at Discharge: Family Type of Home: House Home Access: Stairs to enter   Technical brewer of Steps: 1 Home Layout: One level Home Equipment: Environmental consultant - 2 wheels;Cane - single point      Prior Function Level of Independence: Independent with assistive device(s)               Hand Dominance        Extremity/Trunk Assessment   Upper Extremity Assessment: Overall WFL for tasks assessed           Lower Extremity Assessment: RLE deficits/detail RLE Deficits / Details: 2/5 quads    Cervical / Trunk Assessment: Normal  Communication   Communication: Other (comment) (trach in place)  Cognition Arousal/Alertness: Awake/alert Behavior During Therapy: WFL for tasks assessed/performed Overall Cognitive Status: Within Functional Limits for tasks assessed                      General Comments      Exercises        Assessment/Plan    PT Assessment Patient needs continued PT services  PT Diagnosis Difficulty  walking   PT Problem List Decreased strength;Decreased range of motion;Decreased activity tolerance;Decreased mobility;Decreased knowledge of use of DME;Pain  PT Treatment Interventions DME instruction;Gait training;Stair training;Functional mobility training;Therapeutic activities;Therapeutic exercise;Patient/family education   PT Goals (Current goals can be found in the Care Plan section) Acute Rehab PT Goals Patient Stated Goal: Resume previous lifestyle with decreased pain PT Goal Formulation: With patient Time For Goal Achievement:  12/08/13 Potential to Achieve Goals: Good    Frequency 7X/week   Barriers to discharge        Co-evaluation               End of Session Equipment Utilized During Treatment: Gait belt;Right knee immobilizer Activity Tolerance: Patient tolerated treatment well Patient left: in chair;with call bell/phone within reach Nurse Communication: Mobility status         Time: 0820-0910 PT Time Calculation (min): 50 min   Charges:   PT Evaluation $Initial PT Evaluation Tier I: 1 Procedure PT Treatments $Gait Training: 8-22 mins $Therapeutic Activity: 8-22 mins   PT G Codes:          Ryan Salinas 12/01/2013, 11:12 AM

## 2013-12-01 NOTE — Progress Notes (Signed)
Subjective: 1 Day Post-Op Procedure(s) (LRB): RIGHT TOTAL KNEE ARTHROPLASTY (Right) Patient reports pain as 3 on 0-10 scale Doing well. Hemovac DCd. Will ambulate..    Objective: Vital signs in last 24 hours: Temp:  [97.3 F (36.3 C)-100.3 F (37.9 C)] 100.2 F (37.9 C) (09/10 0500) Pulse Rate:  [62-95] 84 (09/10 0500) Resp:  [6-16] 15 (09/10 0500) BP: (137-195)/(63-101) 137/63 mmHg (09/10 0500) SpO2:  [94 %-100 %] 98 % (09/10 0500) FiO2 (%):  [40 %-90 %] 40 % (09/10 0159) Weight:  [105.235 kg (232 lb)] 105.235 kg (232 lb) (09/09 1543)  Intake/Output from previous day: 09/09 0701 - 09/10 0700 In: 3430 [P.O.:360; I.V.:2910; IV Piggyback:160] Out: 5188 [Urine:3275; Drains:95; Blood:25] Intake/Output this shift:     Recent Labs  12/01/13 0430  HGB 11.7*    Recent Labs  12/01/13 0430  WBC 12.0*  RBC 4.22  HCT 34.5*  PLT 195    Recent Labs  12/01/13 0430  NA 138  K 3.3*  CL 100  CO2 25  BUN 17  CREATININE 1.15  GLUCOSE 234*  CALCIUM 8.3*   No results found for this basename: LABPT, INR,  in the last 72 hours  Dorsiflexion/Plantar flexion intact  Assessment/Plan: 1 Day Post-Op Procedure(s) (LRB): RIGHT TOTAL KNEE ARTHROPLASTY (Right) Up with therapy  Yoshio Seliga A 12/01/2013, 7:03 AM

## 2013-12-01 NOTE — Progress Notes (Signed)
OT Cancellation Note  Patient Details Name: Ryan Salinas MRN: [id number] DOB: 08-06-1934   Cancelled Treatment:    Reason Eval/Treat Not Completed: Other (comment) Note pt +2 with PT and limited distance for ambulation. Will check on pt next date.  Jules Schick [phone] 12/01/2013, 11:47 AM

## 2013-12-02 LAB — GLUCOSE, CAPILLARY
GLUCOSE-CAPILLARY: 282 mg/dL — AB (ref 70–99)
GLUCOSE-CAPILLARY: 228 mg/dL — AB (ref 70–99)
GLUCOSE-CAPILLARY: 252 mg/dL — AB (ref 70–99)
Glucose-Capillary: 310 mg/dL — ABNORMAL HIGH (ref 70–99)
Glucose-Capillary: 292 mg/dL — ABNORMAL HIGH (ref 70–99)

## 2013-12-02 LAB — BASIC METABOLIC PANEL
Anion gap: 13 (ref 5–15)
BUN: 25 mg/dL — ABNORMAL HIGH (ref 6–23)
CO2: 24 meq/L (ref 19–32)
Calcium: 7.8 mg/dL — ABNORMAL LOW (ref 8.4–10.5)
Chloride: 97 mEq/L (ref 96–112)
Creatinine, Ser: 1.72 mg/dL — ABNORMAL HIGH (ref 0.50–1.35)
GFR calc Af Amer: 42 mL/min — ABNORMAL LOW (ref >90)
GFR, EST NON AFRICAN AMERICAN: 37 mL/min — AB (ref >90)
GLUCOSE: 275 mg/dL — AB (ref 70–99)
POTASSIUM: 3.8 meq/L (ref 3.7–5.3)
SODIUM: 134 meq/L — AB (ref 137–147)

## 2013-12-02 LAB — CBC
HCT: 30.3 % — ABNORMAL LOW (ref 39.0–52.0)
HEMOGLOBIN: 10.2 g/dL — AB (ref 13.0–17.0)
MCH: 28.1 pg (ref 26.0–34.0)
MCHC: 33.7 g/dL (ref 30.0–36.0)
MCV: 83.5 fL (ref 78.0–100.0)
PLATELETS: 179 10*3/uL (ref 150–400)
RBC: 3.63 MIL/uL — AB (ref 4.22–5.81)
RDW: 14.4 % (ref 11.5–15.5)
WBC: 15.8 10*3/uL — AB (ref 4.0–10.5)

## 2013-12-02 LAB — URINE CULTURE
Colony Count: NO GROWTH
Culture: NO GROWTH

## 2013-12-02 MED ORDER — RIVAROXABAN 10 MG PO TABS: 10.0000 mg | ORAL_TABLET | Freq: Every day | ORAL | Status: DC

## 2013-12-02 MED ORDER — TAMSULOSIN HCL 0.4 MG PO CAPS: 0.4000 mg | ORAL_CAPSULE | Freq: Every day | ORAL | Status: AC

## 2013-12-02 MED ORDER — TAMSULOSIN HCL 0.4 MG PO CAPS
0.4000 mg | ORAL_CAPSULE | Freq: Every day | ORAL | Status: DC
  Administered 2013-12-02 – 2013-12-03 (×2): 0.4 mg via ORAL
  Filled 2013-12-02 (×2): qty 1

## 2013-12-02 MED ORDER — FERROUS SULFATE 325 (65 FE) MG PO TABS: 325.0000 mg | ORAL_TABLET | Freq: Three times a day (TID) | ORAL | Status: DC

## 2013-12-02 MED ORDER — METHOCARBAMOL 500 MG PO TABS: 500.0000 mg | ORAL_TABLET | Freq: Four times a day (QID) | ORAL | Status: DC | PRN

## 2013-12-02 MED ORDER — OXYCODONE-ACETAMINOPHEN 5-325 MG PO TABS: 1.0000 | ORAL_TABLET | ORAL | Status: DC | PRN

## 2013-12-02 MED ORDER — SODIUM CHLORIDE 0.9 % IV SOLN
INTRAVENOUS | Status: DC
  Administered 2013-12-02: 75 mL/h via INTRAVENOUS

## 2013-12-02 NOTE — Progress Notes (Signed)
Physical Therapy Treatment Patient Details Name: Ryan Salinas MRN: [id number] DOB: May 25, 1934 Today's Date: 12/02/2013    History of Present Illness R TKR, history of tracheostomy    PT Comments    Marked improvement in activity tolerance vs this am.  Follow Up Recommendations  Home health PT     Equipment Recommendations  Rolling walker with 5" wheels    Recommendations for Other Services OT consult     Precautions / Restrictions Precautions Precautions: Knee;Fall Required Braces or Orthoses: Knee Immobilizer - Right Knee Immobilizer - Right: Discontinue once straight leg raise with < 10 degree lag Restrictions Weight Bearing Restrictions: No Other Position/Activity Restrictions: WBAT    Mobility  Bed Mobility Overal bed mobility: Needs Assistance Bed Mobility: Sit to Supine     Supine to sit: Min assist;Mod assist     General bed mobility comments: Assist with R LE and VCs for sequencing  Transfers Overall transfer level: Needs assistance Equipment used: Rolling walker (2 wheeled) Transfers: Sit to/from Stand Sit to Stand: Min assist         General transfer comment: cues for LE management and use of UEs to self assist  Ambulation/Gait Ambulation/Gait assistance: Min assist Ambulation Distance (Feet): 75 Feet (twice) Assistive device: Rolling walker (2 wheeled) Gait Pattern/deviations: Step-to pattern;Decreased step length - right;Decreased step length - left;Shuffle;Trunk flexed Gait velocity: decreased with multiple rest breaks 2* SOB/fatigue   General Gait Details: cues for posture, sequence, stride length and position from RW;  multiple standing rests required for task completion   Stairs            Wheelchair Mobility    Modified Rankin (Stroke Patients Only)       Balance                                    Cognition Arousal/Alertness: Awake/alert Behavior During Therapy: WFL for tasks assessed/performed Overall  Cognitive Status: Within Functional Limits for tasks assessed                      Exercises      General Comments        Pertinent Vitals/Pain Pain Assessment: 0-10 Pain Score: 5  Pain Location: R knee Pain Descriptors / Indicators: Aching;Sore Pain Intervention(s): Limited activity within patient's tolerance;Monitored during session;Ice applied    Home Living                      Prior Function            PT Goals (current goals can now be found in the care plan section) Acute Rehab PT Goals Patient Stated Goal: return to independence PT Goal Formulation: With patient Time For Goal Achievement: 12/08/13 Potential to Achieve Goals: Good Progress towards PT goals: Progressing toward goals    Frequency  7X/week    PT Plan Current plan remains appropriate    Co-evaluation             End of Session Equipment Utilized During Treatment: Gait belt;Right knee immobilizer Activity Tolerance: Patient tolerated treatment well Patient left: in bed;with call bell/phone within reach;with nursing/sitter in room     Time: 4098-1191 PT Time Calculation (min): 40 min  Charges:  $Gait Training: 23-37 mins                    G Codes:  Ryan Salinas 12/02/2013, 4:14 PM

## 2013-12-02 NOTE — Progress Notes (Signed)
Physical Therapy Treatment Patient Details Name: Ryan Salinas MRN: [id number] DOB: 05-10-1934 Today's Date: 12/02/2013    History of Present Illness R TKR, history of tracheostomy    PT Comments    Pt very pleasant and motivated but requiring increased time all tasks and with frequent breaks 2* fatigue, SOB and c/o mild dizziness with ambulation (BP 112/56).  Follow Up Recommendations  Home health PT     Equipment Recommendations  Rolling walker with 5" wheels    Recommendations for Other Services OT consult     Precautions / Restrictions Precautions Precautions: Knee;Fall Required Braces or Orthoses: Knee Immobilizer - Right Knee Immobilizer - Right: Discontinue once straight leg raise with < 10 degree lag Restrictions Weight Bearing Restrictions: No Other Position/Activity Restrictions: WBAT    Mobility  Bed Mobility Overal bed mobility: Needs Assistance Bed Mobility: Supine to Sit     Supine to sit: Mod assist     General bed mobility comments: assist for R LE over to EOB even with pt attempting to use L LE to help. Heavy use of rails.   Transfers Overall transfer level: Needs assistance Equipment used: Rolling walker (2 wheeled) Transfers: Sit to/from Stand Sit to Stand: Min assist         General transfer comment: cues for LE management and use of UEs to self assist  Ambulation/Gait Ambulation/Gait assistance: Min assist;+2 safety/equipment (chair follow - pt fatigues easily) Ambulation Distance (Feet): 33 Feet (and 23) Assistive device: Rolling walker (2 wheeled) Gait Pattern/deviations: Step-to pattern;Decreased step length - right;Decreased step length - left;Shuffle;Trunk flexed Gait velocity: decreased with multiple rest breaks 2* SOB/fatigue   General Gait Details: cues for posture, sequence, stride length and position from RW;  several standing rests required for task completion   Stairs            Wheelchair Mobility    Modified  Rankin (Stroke Patients Only)       Balance                                    Cognition Arousal/Alertness: Awake/alert Behavior During Therapy: WFL for tasks assessed/performed Overall Cognitive Status: Within Functional Limits for tasks assessed                      Exercises Total Joint Exercises Ankle Circles/Pumps: AROM;Both;15 reps;Supine Quad Sets: AROM;Both;20 reps;Supine Heel Slides: AAROM;Right;20 reps;Supine Straight Leg Raises: AAROM;Right;20 reps;Supine Goniometric ROM: AAROM at R knee -10 - 70    General Comments        Pertinent Vitals/Pain Pain Assessment: 0-10 Pain Score: 4  Pain Location: R knee Pain Descriptors / Indicators: Aching;Sore Pain Intervention(s): Limited activity within patient's tolerance    Home Living Family/patient expects to be discharged to:: Private residence Living Arrangements: Spouse/significant other Available Help at Discharge: Family Type of Home: House Home Access: Stairs to enter   Home Layout: One level Home Equipment: Environmental consultant - 2 wheels;Cane - single point;Bedside commode;Adaptive equipment      Prior Function Level of Independence: Independent with assistive device(s)          PT Goals (current goals can now be found in the care plan section) Acute Rehab PT Goals Patient Stated Goal: return to independence PT Goal Formulation: With patient Time For Goal Achievement: 12/08/13 Potential to Achieve Goals: Good Progress towards PT goals: Progressing toward goals    Frequency  7X/week  PT Plan Current plan remains appropriate    Co-evaluation             End of Session Equipment Utilized During Treatment: Gait belt;Right knee immobilizer Activity Tolerance: Patient tolerated treatment well;Patient limited by fatigue Patient left: in chair;with call bell/phone within reach;with family/visitor present     Time: 2353-6144 PT Time Calculation (min): 43 min  Charges:  $Gait  Training: 8-22 mins $Therapeutic Exercise: 23-37 mins                    G Codes:      Ryan Salinas 12-13-13, 12:46 PM

## 2013-12-02 NOTE — Progress Notes (Addendum)
RT assessed stoma. There is no redness or drainage. RT will monitor and assess as needed.Patient encouraged to call RN if he needs respiratory.

## 2013-12-02 NOTE — Evaluation (Signed)
Occupational Therapy Evaluation Patient Details Name: Ryan Salinas MRN: [id number] DOB: 1934/05/31 Today's Date: 12/02/2013    History of Present Illness R TKR, history of tracheostomy   Clinical Impression   Pt fatigues fairly quickly with pivot transfers to [facility] and chair. He has a girlfriend that is able to assist but do not feel pt is ready to d/c home today from a functional standpoint. Will follow to progress ADL independence.     Follow Up Recommendations  Home health OT;Supervision/Assistance - 24 hour    Equipment Recommendations  None recommended by OT    Recommendations for Other Services       Precautions / Restrictions Precautions Precautions: Knee;Fall Required Braces or Orthoses: Knee Immobilizer - Right Knee Immobilizer - Right: Discontinue once straight leg raise with < 10 degree lag Restrictions Weight Bearing Restrictions: No Other Position/Activity Restrictions: WBAT      Mobility Bed Mobility Overal bed mobility: Needs Assistance Bed Mobility: Supine to Sit     Supine to sit: Mod assist     General bed mobility comments: assist for R LE over to EOB even with pt attempting to use L LE to help. Heavy use of rails.   Transfers Overall transfer level: Needs assistance Equipment used: Rolling walker (2 wheeled) Transfers: Sit to/from Stand Sit to Stand: Min assist;Mod assist         General transfer comment: min assist from elevated bed and 3in1, mod assist to safely descend to chair.    Balance                                            ADL Overall ADL's : Needs assistance/impaired Eating/Feeding: Independent;Bed level   Grooming: Set up;Sitting   Upper Body Bathing: Supervision/ safety;Set up;Sitting   Lower Body Bathing: +2 for safety/equipment;Sit to/from stand;Moderate assistance   Upper Body Dressing : Set up;Supervision/safety;Sitting   Lower Body Dressing: +2 for safety/equipment;Maximal assistance;Sit  to/from stand   Toilet Transfer: +2 for safety/equipment;Stand-pivot;BSC;RW;Minimal assistance;Moderate assistance   Toileting- Clothing Manipulation and Hygiene: Sit to/from stand;+2 for safety/equipment;Minimal assistance;Moderate assistance         General ADL Comments: Pt has all AE from back surgery and is familiar with use. He fatigues fairly easily with activity and tolerated pivot to [facility] and then to chair and needed a rest break on [facility] and then in chair. Sats 99% on RA. Educated on KI wear and how to don/when to use. Discussed placement of 3in1 closer to where he will be during the day and use aas BSC initially until activity tolerance improves.      Vision                     Perception     Praxis      Pertinent Vitals/Pain Pain Assessment: 0-10 Pain Score: 4  Pain Location: R knee Pain Descriptors / Indicators: Aching Pain Intervention(s): Repositioned;Ice applied     Hand Dominance     Extremity/Trunk Assessment Upper Extremity Assessment Upper Extremity Assessment: Overall WFL for tasks assessed           Communication Communication Communication: Other (comment) (trach in place)   Cognition Arousal/Alertness: Awake/alert Behavior During Therapy: WFL for tasks assessed/performed Overall Cognitive Status: Within Functional Limits for tasks assessed  General Comments       Exercises       Shoulder Instructions      Home Living Family/patient expects to be discharged to:: Private residence Living Arrangements: Spouse/significant other Available Help at Discharge: Family Type of Home: House Home Access: Stairs to enter Technical brewer of Steps: 1   Home Layout: One level     Bathroom Shower/Tub: Occupational psychologist: Handicapped height     Home Equipment: Environmental consultant - 2 wheels;Cane - single point;Bedside commode;Adaptive equipment Adaptive Equipment: Reacher;Sock aid;Long-handled shoe  horn        Prior Functioning/Environment Level of Independence: Independent with assistive device(s)             OT Diagnosis: Generalized weakness   OT Problem List: Decreased strength;Decreased knowledge of use of DME or AE   OT Treatment/Interventions: Self-care/ADL training;Patient/family education;DME and/or AE instruction;Therapeutic activities    OT Goals(Current goals can be found in the care plan section) Acute Rehab OT Goals Patient Stated Goal: return to independence OT Goal Formulation: With patient Time For Goal Achievement: 12/09/13 Potential to Achieve Goals: Good  OT Frequency: Min 2X/week   Barriers to D/C:            Co-evaluation              End of Session Equipment Utilized During Treatment: Gait belt;Rolling walker;Right knee immobilizer  Activity Tolerance: Patient limited by fatigue Patient left: in chair;with call bell/phone within reach   Time: 0910-0945 OT Time Calculation (min): 35 min Charges:  OT General Charges $OT Visit: 1 Procedure OT Evaluation $Initial OT Evaluation Tier I: 1 Procedure OT Treatments $Self Care/Home Management : 8-22 mins $Therapeutic Activity: 8-22 mins G-Codes:    Jules Schick [phone] 12/02/2013, 10:36 AM

## 2013-12-02 NOTE — Progress Notes (Signed)
Subjective: 2 Days Post-Op Procedure(s) (LRB): RIGHT TOTAL KNEE ARTHROPLASTY (Right) Patient reports pain as 2 on 0-10 scale. Urine is clear. No major problems today. Will  DC white blood cell count is elevated,but no Wound issues.  Objective: Vital signs in last 24 hours: Temp:  [98.4 F (36.9 C)-99.4 F (37.4 C)] 98.4 F (36.9 C) (09/11 0503) Pulse Rate:  [59-86] 72 (09/11 0503) Resp:  [17-18] 17 (09/11 0503) BP: (89-148)/(48-87) 105/64 mmHg (09/11 0503) SpO2:  [94 %-100 %] 96 % (09/11 0503)  Intake/Output from previous day: 09/10 0701 - 09/11 0700 In: 979.5 [P.O.:840; I.V.:139.5] Out: 985 [Urine:985] Intake/Output this shift:     Recent Labs  12/01/13 0430 12/02/13 0432  HGB 11.7* 10.2*    Recent Labs  12/01/13 0430 12/02/13 0432  WBC 12.0* 15.8*  RBC 4.22 3.63*  HCT 34.5* 30.3*  PLT 195 179    Recent Labs  12/01/13 0430 12/02/13 0432  NA 138 134*  K 3.3* 3.8  CL 100 97  CO2 25 24  BUN 17 25*  CREATININE 1.15 1.72*  GLUCOSE 234* 275*  CALCIUM 8.3* 7.8*   No results found for this basename: LABPT, INR,  in the last 72 hours  Dorsiflexion/Plantar flexion intact Compartment soft  Assessment/Plan: 2 Days Post-Op Procedure(s) (LRB): RIGHT TOTAL KNEE ARTHROPLASTY (Right) Discharge home with home health  Deyanna Mctier A 12/02/2013, 7:05 AM

## 2013-12-02 NOTE — Progress Notes (Signed)
PHARMACIST - PHYSICIAN COMMUNICATION DR:  Gladstone Lighter CONCERNING:  METFORMIN SAFE ADMINISTRATION POLICY  RECOMMENDATION: Metformin has been placed on DISCONTINUE (rejected order) STATUS and should be reordered only after any of the conditions below are ruled out.  Current safety recommendations include avoiding metformin for a minimum of 48 hours after the patient's exposure to intravenous contrast media.  DESCRIPTION:  The Pharmacy Committee has adopted a policy that restricts the use of metformin in hospitalized patients until all the contraindications to administration have been ruled out. Specific contraindications are: [x]  Serum creatinine ? 1.5 for males []  Serum creatinine ? 1.4 for females []  Shock, acute MI, sepsis, hypoxemia, dehydration []  Planned administration of intravenous iodinated contrast media []  Heart Failure patients with low EF []  Acute or chronic metabolic acidosis (including DKA)     Doreene Eland, PharmD, BCPS.   Pager: [phone] 12/02/2013 2:26 PM

## 2013-12-02 NOTE — Progress Notes (Signed)
Patient's stoma looks clean and with no apparent complications.  Instructed patient if he needed anything to call RT.

## 2013-12-03 LAB — CBC
HCT: 29.4 % — ABNORMAL LOW (ref 39.0–52.0)
Hemoglobin: 9.7 g/dL — ABNORMAL LOW (ref 13.0–17.0)
MCH: 27.6 pg (ref 26.0–34.0)
MCHC: 33 g/dL (ref 30.0–36.0)
MCV: 83.8 fL (ref 78.0–100.0)
PLATELETS: 169 10*3/uL (ref 150–400)
RBC: 3.51 MIL/uL — AB (ref 4.22–5.81)
RDW: 14.4 % (ref 11.5–15.5)
WBC: 12.4 10*3/uL — AB (ref 4.0–10.5)

## 2013-12-03 LAB — BASIC METABOLIC PANEL
ANION GAP: 12 (ref 5–15)
BUN: 26 mg/dL — ABNORMAL HIGH (ref 6–23)
CO2: 24 mEq/L (ref 19–32)
CREATININE: 1.44 mg/dL — AB (ref 0.50–1.35)
Calcium: 8 mg/dL — ABNORMAL LOW (ref 8.4–10.5)
Chloride: 102 mEq/L (ref 96–112)
GFR calc non Af Amer: 45 mL/min — ABNORMAL LOW (ref >90)
GFR, EST AFRICAN AMERICAN: 53 mL/min — AB (ref >90)
Glucose, Bld: 208 mg/dL — ABNORMAL HIGH (ref 70–99)
POTASSIUM: 4.2 meq/L (ref 3.7–5.3)
Sodium: 138 mEq/L (ref 137–147)

## 2013-12-03 LAB — GLUCOSE, CAPILLARY: GLUCOSE-CAPILLARY: 180 mg/dL — AB (ref 70–99)

## 2013-12-03 NOTE — Progress Notes (Signed)
Patient d/c'd to home.  Reviewed discharge paperwork with patient and spouse, both verbalized understanding.  Prescriptions given to patient's wife.  Confirmed patient had no add'l home equipment needs.  Patient escorted from floor with NT via wheelchair.  Christen Bame RN

## 2013-12-03 NOTE — Progress Notes (Signed)
     Subjective: 3 Days Post-Op Procedure(s) (LRB): RIGHT TOTAL KNEE ARTHROPLASTY (Right)   Patient reports pain as mild, pain well controlled. No events throughout the night. Issues with urination yesterday have resolved and he is passing urine without difficulty.  Objective:   VITALS:   Filed Vitals:   12/03/13  BP: 138/60  Pulse: 80  Temp: 98.9 F (37.2 C)   Resp: 18    Dorsiflexion/Plantar flexion intact Incision: dressing C/D/I No cellulitis present Compartment soft Aquacel dressing in place with no drainage apparent  LABS  Recent Labs  12/01/13 0430 12/02/13 0432 12/03/13 0430  HGB 11.7* 10.2* 9.7*  HCT 34.5* 30.3* 29.4*  WBC 12.0* 15.8* 12.4*  PLT 195 179 169     Recent Labs  12/01/13 0430 12/02/13 0432 12/03/13 0430  NA 138 134* 138  K 3.3* 3.8 4.2  BUN 17 25* 26*  CREATININE 1.15 1.72* 1.44*  GLUCOSE 234* 275* 208*     Assessment/Plan: 3 Days Post-Op Procedure(s) (LRB): RIGHT TOTAL KNEE ARTHROPLASTY (Right) Up with therapy Discharge home with home health Follow up in 2 weeks at [facility]. Follow up with Dr Gladstone Lighter in 2 weeks.  Contact information:  [facility] [address], [address] [place]   PAC  12/03/2013, 8:20 AM

## 2013-12-03 NOTE — Progress Notes (Signed)
Physical Therapy Treatment Patient Details Name: Ryan Salinas MRN: [id number] DOB: 1934/12/08 Today's Date: 12/03/2013    History of Present Illness R TKR, history of tracheostomy    PT Comments    Marked improvement in activity tolerance this date.  Pt requests return visit to review stairs when spouse available  Follow Up Recommendations  Home health PT     Equipment Recommendations  Rolling walker with 5" wheels    Recommendations for Other Services OT consult     Precautions / Restrictions Precautions Precautions: Knee;Fall Required Braces or Orthoses: Knee Immobilizer - Right Knee Immobilizer - Right: Discontinue once straight leg raise with < 10 degree lag Restrictions Weight Bearing Restrictions: No Other Position/Activity Restrictions: WBAT    Mobility  Bed Mobility Overal bed mobility: Needs Assistance Bed Mobility: Supine to Sit     Supine to sit: Min assist     General bed mobility comments: Assist with R LE and VCs for sequencing  Transfers Overall transfer level: Needs assistance Equipment used: Rolling walker (2 wheeled) Transfers: Sit to/from Stand Sit to Stand: Min guard         General transfer comment: cues for LE management and use of UEs to self assist  Ambulation/Gait Ambulation/Gait assistance: Min assist;Min guard Ambulation Distance (Feet): 100 Feet Assistive device: Rolling walker (2 wheeled) Gait Pattern/deviations: Step-to pattern;Decreased step length - right;Decreased step length - left;Shuffle;Trunk flexed Gait velocity: decreased with multiple rest breaks 2* SOB/fatigue   General Gait Details: cues for posture, sequence, stride length and position from RW;  multiple standing rests required for task completion   Stairs Stairs: Yes Stairs assistance: Min assist Stair Management: No rails;Step to pattern;Backwards;With walker Number of Stairs: 2 General stair comments: cues for sequence and foot/RW placement  Wheelchair  Mobility    Modified Rankin (Stroke Patients Only)       Balance                                    Cognition Arousal/Alertness: Awake/alert Behavior During Therapy: WFL for tasks assessed/performed Overall Cognitive Status: Within Functional Limits for tasks assessed                      Exercises Total Joint Exercises Ankle Circles/Pumps: AROM;Both;15 reps;Supine Quad Sets: AROM;Both;20 reps;Supine Heel Slides: AAROM;Right;20 reps;Supine Straight Leg Raises: AAROM;Right;20 reps;Supine    General Comments        Pertinent Vitals/Pain Pain Assessment: 0-10 Pain Score: 3  Pain Location: R knee Pain Descriptors / Indicators: Sore Pain Intervention(s): Limited activity within patient's tolerance;Monitored during session;Premedicated before session;Ice applied    Home Living                      Prior Function            PT Goals (current goals can now be found in the care plan section) Acute Rehab PT Goals Patient Stated Goal: return to independence PT Goal Formulation: With patient Time For Goal Achievement: 12/08/13 Potential to Achieve Goals: Good Progress towards PT goals: Progressing toward goals    Frequency  7X/week    PT Plan Current plan remains appropriate    Co-evaluation             End of Session Equipment Utilized During Treatment: Gait belt;Right knee immobilizer Activity Tolerance: Patient tolerated treatment well Patient left: in chair;with call bell/phone within reach  Time: 1282-0813 PT Time Calculation (min): 53 min  Charges:  $Gait Training: 23-37 mins $Therapeutic Exercise: 23-37 mins                    G Codes:      Ryan Salinas 2013-12-08, 1:10 PM

## 2013-12-03 NOTE — Progress Notes (Signed)
Occupational Therapy Treatment Patient Details Name: Ryan Salinas MRN: [id number] DOB: [birth year] Today's Date: 12/03/2013    History of present illness R TKR, history of tracheostomy   OT comments  Patient doing much better, able to ambulate to and from bathroom and perform toileting task.  Follow Up Recommendations  Home health OT;Supervision/Assistance - 24 hour    Equipment Recommendations  None recommended by OT    Recommendations for Other Services      Precautions / Restrictions Precautions Precautions: Knee;Fall Required Braces or Orthoses: Knee Immobilizer - Right Knee Immobilizer - Right: Discontinue once straight leg raise with < 10 degree lag Restrictions Other Position/Activity Restrictions: WBAT       Mobility Bed Mobility Overal bed mobility: Needs Assistance Bed Mobility: Sit to Supine Rolling: Min assist         General bed mobility comments: Assist with R LE and VCs for sequencing  Transfers                      Balance                                   ADL                           Toilet Transfer: Min guard   Toileting- Clothing Manipulation and Hygiene: Min guard         General ADL Comments: Patient ambulated with RW to/from bathroom min guard A. Discussed AE for LB self-care. Patient reports he remembers how to use it and does not need to practice.       Vision                     Perception     Praxis      Cognition   Behavior During Therapy: WFL for tasks assessed/performed Overall Cognitive Status: Within Functional Limits for tasks assessed                       Extremity/Trunk Assessment               Exercises     Shoulder Instructions       General Comments      Pertinent Vitals/ Pain       Pain Assessment: No/denies pain  Home Living                                          Prior Functioning/Environment               Frequency Min 2X/week     Progress Toward Goals  OT Goals(current goals can now be found in the care plan section)  Progress towards OT goals: Progressing toward goals  Acute Rehab OT Goals Patient Stated Goal: return to independence  Plan Discharge plan remains appropriate    Co-evaluation                 End of Session Equipment Utilized During Treatment: Gait belt;Rolling walker;Right knee immobilizer   Activity Tolerance Patient tolerated treatment well   Patient Left in bed;with call bell/phone within reach   Nurse Communication Mobility status        Time: 6295-2841 OT Time Calculation (min): 15 min  Charges: OT General Charges $OT Visit: 1 Procedure OT Treatments $Self Care/Home Management : 8-22 mins  Ryan Salinas A 12/03/2013, 9:20 AM

## 2013-12-03 NOTE — Progress Notes (Signed)
Physical Therapy Treatment Patient Details Name: Ryan Salinas MRN: [id number] DOB: Jul 24, 1934 Today's Date: 12/03/2013    History of Present Illness R TKR, history of tracheostomy    PT Comments    Reviewed stairs with pt and spouse.  Pt and spouse state comfortable with don/doff KI  Follow Up Recommendations  Home health PT     Equipment Recommendations  Rolling walker with 5" wheels    Recommendations for Other Services OT consult     Precautions / Restrictions Precautions Precautions: Knee;Fall Required Braces or Orthoses: Knee Immobilizer - Right Knee Immobilizer - Right: Discontinue once straight leg raise with < 10 degree lag Restrictions Weight Bearing Restrictions: No Other Position/Activity Restrictions: WBAT    Mobility  Bed Mobility Overal bed mobility: Needs Assistance Bed Mobility: Supine to Sit     Supine to sit: Min assist     General bed mobility comments: Assist with R LE and VCs for sequencing  Transfers Overall transfer level: Needs assistance Equipment used: Rolling walker (2 wheeled) Transfers: Sit to/from Stand Sit to Stand: Min guard         General transfer comment: cues for LE management and use of UEs to self assist  Ambulation/Gait Ambulation/Gait assistance: Min guard Ambulation Distance (Feet): 48 Feet Assistive device: Rolling walker (2 wheeled) Gait Pattern/deviations: Step-to pattern;Decreased step length - right;Decreased step length - left;Shuffle;Trunk flexed Gait velocity: decreased with multiple rest breaks 2* SOB/fatigue   General Gait Details: cues for posture, sequence, stride length and position from RW;  multiple standing rests required for task completion   Stairs Stairs: Yes Stairs assistance: Min assist Stair Management: No rails;Step to pattern;Backwards;With walker Number of Stairs: 2 General stair comments: cues for sequence and foot/RW placement - spouse assisting pt  Wheelchair Mobility     Modified Rankin (Stroke Patients Only)       Balance                                    Cognition Arousal/Alertness: Awake/alert Behavior During Therapy: WFL for tasks assessed/performed Overall Cognitive Status: Within Functional Limits for tasks assessed                      Exercises Total Joint Exercises Ankle Circles/Pumps: AROM;Both;15 reps;Supine Quad Sets: AROM;Both;20 reps;Supine Heel Slides: AAROM;Right;20 reps;Supine Straight Leg Raises: AAROM;Right;20 reps;Supine    General Comments        Pertinent Vitals/Pain Pain Assessment: 0-10 Pain Score: 2  Pain Location: R knee Pain Descriptors / Indicators: Sore Pain Intervention(s): Monitored during session;Limited activity within patient's tolerance    Home Living                      Prior Function            PT Goals (current goals can now be found in the care plan section) Acute Rehab PT Goals Patient Stated Goal: return to independence PT Goal Formulation: With patient Time For Goal Achievement: 12/08/13 Potential to Achieve Goals: Good Progress towards PT goals: Progressing toward goals    Frequency  7X/week    PT Plan Current plan remains appropriate    Co-evaluation             End of Session Equipment Utilized During Treatment: Gait belt;Right knee immobilizer Activity Tolerance: Patient tolerated treatment well Patient left: in chair;with call bell/phone within reach     Time:  7673-4193 PT Time Calculation (min): 15 min  Charges:  $Gait Training: 8-22 mins $Therapeutic Exercise: 23-37 mins                    G Codes:      Ryan Salinas December 26, 2013, 1:17 PM

## 2013-12-04 NOTE — Discharge Summary (Signed)
Physician Discharge Summary  Patient ID: Ryan Salinas MRN: [id number] DOB/AGE: Mar 27, 1934 79 y.o.  Admit date: 11/30/2013 Discharge date: 12/03/2013   Procedures:  Procedure(s) (LRB): RIGHT TOTAL KNEE ARTHROPLASTY (Right)  Attending Physician:  Dr. Latanya Maudlin  Admission Diagnoses:   Right knee pain  Discharge Diagnoses:  Active Problems:   Total knee replacement status  Past Medical History  Diagnosis Date  . 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 Dysrhythmia     HX OF ATRIAL PREMATURE CONTRACTIONS--DR. WALLMEYER IS PT'S CARDIOLOGIST - [place] [facility] [place]  . Hypertension   . Hypercholesterolemia   . Cancer     LARYNGEAL CANCER  . Diabetes mellitus without complication   . Multiple nodules of lung     RHEUMATOID ARTHRITIS OF LUNGS -TAKES DAILY PREDNISONE; POST INFLAMMATORY PULMONARY FIBROSIS, FIBROTIC CHANGES BOTH LUNGS; HAS A COUGH AND PHLEGM  . Hypothyroidism   . Shortness of breath     WITH ANY EXERTION - DUE TO ARTHRITIS OF LUNS  . Pain     IN RIGHT KNEE - HAS MENISCAL TEAR  . COPD (chronic obstructive pulmonary disease)     MODERATE-SEVERE - PULMONOGIST IS DR. MARK DONER WITH [facility] [place]  . Arthritis     RA OF LUNGS ONLY  . Pneumonia     hx of     HPI: Ryan Salinas, 79 y.o. male, has a history of pain and functional disability in the right knee due to arthritis and has failed non-surgical conservative treatments for greater than 12 weeks to includeNSAID's and/or analgesics, corticosteriod injections, viscosupplementation injections, use of assistive devices and activity modification. Onset of symptoms was gradual, starting 5 years ago with gradually worsening course since that time. The patient noted prior procedures on the knee to include arthroscopy and menisectomy on the right knee(s). Patient currently rates pain in the right knee(s) at 8 out of 10 with  activity. Patient has night pain, worsening of pain with activity and weight bearing, pain that interferes with activities of daily living, pain with passive range of motion, crepitus and joint swelling. Patient has evidence of periarticular osteophytes and joint space narrowing by imaging studies. There is no active infection.  PCP: Guadlupe Spanish, MD   Discharged Condition: good  Hospital Course:  Patient underwent the above stated procedure on 11/30/2013. Patient tolerated the procedure well and brought to the recovery room in good condition and subsequently to the floor.  POD #1 BP: 137/63 ; Pulse: 84 ; Temp: 100.2 F (37.9 C) ; Resp: 15 Patient reports pain as 3 on 0-10 scale Doing well. Hemovac d/c'ed. Will ambulate with PT. Dorsiflexion/plantar flexion intact, incision: dressing C/D/I, no cellulitis present and compartment soft.   LABS  Basename    HGB  11.7  HCT  34.5   POD #2  BP: 105/64 ; Pulse: 72 ; Temp: 98.4 F (36.9 C) ; Resp: 17 Patient reports pain as 2 on 0-10 scale. Urine is clear. No major problems today. White blood cell count is elevated,but no wound issues.  Urinary retention issues and he was not d/c'ed home. Dorsiflexion/plantar flexion intact, incision: dressing C/D/I, no cellulitis present and compartment soft.   LABS  Basename    HGB  10.2  HCT  30.3   POD #3  BP: 138/60 ; Pulse: 80 ; Temp: 98.9 F (37.2 C) ; Resp: 18 Patient reports pain as mild, pain well controlled. No  events throughout the night. Issues with urination yesterday have resolved and he is passing urine without difficulty.  Ready to be discharged home. Dorsiflexion/plantar flexion intact, incision: dressing C/D/I, no cellulitis present and compartment soft.   LABS  Basename    HGB  9.7  HCT  29.4    Discharge Exam: General appearance: alert, cooperative and no distress Extremities: Homans sign is negative, no sign of DVT, no edema, redness or tenderness in the calves or thighs and no  ulcers, gangrene or trophic changes  Disposition:    Home with follow up in 2 weeks   Follow-up Information   Follow up with [facility] (home health physical therapy)    Contact information:   [address] [place] [address] [phone]       Follow up with GIOFFRE,RONALD A, MD. Schedule an appointment as soon as possible for a visit in 2 weeks.   Specialty:  Orthopedic Surgery   Contact information:   [address] [place] [address] [phone]       Discharge Instructions   Call MD / Call 911    Complete by:  As directed   If you experience chest pain or shortness of breath, CALL 911 and be transported to the hospital emergency room.  If you develope a fever above 101 F, pus (white drainage) or increased drainage or redness at the wound, or calf pain, call your surgeon's office.     Constipation Prevention    Complete by:  As directed   Drink plenty of fluids.  Prune juice may be helpful.  You may use a stool softener, such as Colace (over the counter) 100 mg twice a day.  Use MiraLax (over the counter) for constipation as needed.     Diet Carb Modified    Complete by:  As directed      Discharge instructions    Complete by:  As directed   Walk with your walker. Weight bearing as tolerated [place] will follow you at home for your therapy  Do not change your dressing unless there is excess drainage Change dressing over drain site Shower only, no tub bath. Call if any temperatures greater than 101 or any wound complications: [phone] during the day and ask for Dr. Charlestine Night nurse, Brunilda Payor.     Do not put a pillow under the knee. Place it under the heel.    Complete by:  As directed      Driving restrictions    Complete by:  As directed   No driving     Increase activity slowly as tolerated    Complete by:  As directed              Medication List    STOP taking these medications        naproxen sodium 220 MG tablet  Commonly known as:  ANAPROX      TAKE these medications       albuterol (2.5 MG/3ML) 0.083% nebulizer solution  Commonly known as:  PROVENTIL  Take 2.5 mg by nebulization every 6 (six) hours as needed for wheezing or shortness of breath.     amLODipine 5 MG tablet  Commonly known as:  NORVASC  Take 5 mg by mouth every morning.     benazepril 40 MG tablet  Commonly known as:  LOTENSIN  Take 40 mg by mouth 2 (two) times daily.     budesonide 0.25 MG/2ML nebulizer solution  Commonly known as:  PULMICORT  Take 0.25 mg by nebulization every morning.     cloNIDine 0.1 MG tablet  Commonly known as:  CATAPRES  Take 0.1 mg by mouth at bedtime.     ferrous sulfate 325 (65 FE) MG tablet  Take 1 tablet (325 mg total) by mouth 3 (three) times daily after meals.     furosemide 40 MG tablet  Commonly known as:  LASIX  Take 20 mg by mouth daily. TAKES 1/2 TAB EVERY AM     glipiZIDE 10 MG 24 hr tablet  Commonly known as:  GLUCOTROL XL  Take 10 mg by mouth every evening.     labetalol 300 MG tablet  Commonly known as:  NORMODYNE  Take 300 mg by mouth 2 (two) times daily.     levothyroxine 100 MCG tablet  Commonly known as:  SYNTHROID, LEVOTHROID  Take 100 mcg by mouth daily before breakfast.     metFORMIN 500 MG tablet  Commonly known as:  GLUCOPHAGE  Take 1,000 mg by mouth 2 (two) times daily with a meal.     methocarbamol 500 MG tablet  Commonly known as:  ROBAXIN  Take 1 tablet (500 mg total) by mouth every 6 (six) hours as needed for muscle spasms.     oxyCODONE-acetaminophen 5-325 MG per tablet  Commonly known as:  PERCOCET/ROXICET  Take 1-2 tablets by mouth every 4 (four) hours as needed for moderate pain.     pravastatin 20 MG tablet  Commonly known as:  PRAVACHOL  Take 20 mg by mouth at bedtime.     predniSONE 2.5 MG tablet  Commonly known as:  DELTASONE  Take 2.5 mg by mouth 2 (two) times daily. PT STATES HE TAKES FOR ARTHRITIS OF  LUNGS AND PREVENTS BLEEDING FROM LUNGS     rivaroxaban 10 MG Tabs tablet  Commonly known as:  XARELTO  Take 1 tablet (10 mg total) by mouth daily with breakfast.     sodium chloride 0.9 % nebulizer solution  Take 3 mLs by nebulization as needed (Dryness in lungs).     tamsulosin 0.4 MG Caps capsule  Commonly known as:  FLOMAX  Take 1 capsule (0.4 mg total) by mouth daily.         Signed: [place] Pugh. Sabine Tenenbaum   PA-C  12/04/2013, 1:08 PM

## 2016-11-10 NOTE — Progress Notes (Signed)
Please place orders in EPIC as patient is being scheduled for a pre-op appointment! Thank you! 

## 2016-11-26 NOTE — H&P (Signed)
TOTAL KNEE ADMISSION H&P  Patient is being admitted for left total knee arthroplasty.  Subjective:  Chief Complaint:left knee pain.  HPI: Ryan Salinas, 82 y.o. male, has a history of pain and functional disability in the left knee due to arthritis and has failed non-surgical conservative treatments for greater than 12 weeks to includeNSAID's and/or analgesics, corticosteriod injections, use of assistive devices and activity modification.  Onset of symptoms was gradual, starting >10 years ago with gradually worsening course since that time. The patient noted no past surgery on the left knee(s).  Patient currently rates pain in the left knee(s) at 8 out of 10 with activity. Patient has night pain, worsening of pain with activity and weight bearing, pain that interferes with activities of daily living, pain with passive range of motion, crepitus and joint swelling.  Patient has evidence of periarticular osteophytes and joint space narrowing by imaging studies. There is no active infection.  Patient Active Problem List   Diagnosis Date Noted  . Total knee replacement status 11/30/2013  . Osteoarthritis of right knee 05/25/2013  . Lateral meniscus tear 05/25/2013   Past Medical History:  Diagnosis Date  . Arthritis    RA OF LUNGS ONLY  . Cancer    LARYNGEAL CANCER  . COPD (chronic obstructive pulmonary disease)    MODERATE-SEVERE - PULMONOGIST IS DR. MARK DONER WITH [facility] [place]  . Diabetes mellitus without complication   . Dysrhythmia    HX OF ATRIAL PREMATURE CONTRACTIONS--DR. WALLMEYER IS PT'S CARDIOLOGIST -  [facility] [place]  . Hypercholesterolemia   . Hypertension   . Hypothyroidism   . Multiple nodules of lung    RHEUMATOID ARTHRITIS OF LUNGS -TAKES DAILY PREDNISONE; POST INFLAMMATORY PULMONARY FIBROSIS, FIBROTIC CHANGES BOTH LUNGS; HAS A COUGH AND PHLEGM  . Pain    IN RIGHT KNEE - HAS MENISCAL TEAR  . Pneumonia    hx of   .  Shortness of breath    WITH ANY EXERTION - DUE TO ARTHRITIS OF LUNS  . Tracheostomy in place    FOR LARYNGEAL CANCER; HX OF RADAITION TX PRIOR TO THE SURGERY    Past Surgical History:  Procedure Laterality Date  . BACK SURGERY  1-17 2014   LUMBAR SURGERY / FUSION WITH HARDWARE  - AT [place]   PREVIOUS HX OF 2 LUMBAR SURGERIES BACK IN THE EARLY 80"S  . BRONCHOSCOPY  03-30-09   FOR BLEEDING IN LUNGS - PT DIAGNOSED WITH RA OF LUNGS  . EYE SURGERY     CATARACT EXTRACTIONS LEFT EY 08-12-06 AND RIGHT 09-18-10  . KNEE ARTHROSCOPY WITH MEDIAL MENISECTOMY Right 05/25/2013   Procedure: RIGHT KNEE ARTHROSCOPY WITH LATERAL MENISCECTOMY, LOOSE BODY REMOVAL, ABRASION CHONDROPLASTY MEDIAL FEMORAL CONDYLE, SUPRAPATELLAR SYNOVECTOMY;  Surgeon: Tobi Bastos, MD;  Location: [facility];  Service: Orthopedics;  Laterality: Right;  . THORACOTOMY AND THORACOSCOPY  FEB 2011   FOR BLEEDING INTO LUNGS AND BX   . TOTAL KNEE ARTHROPLASTY Right 11/30/2013   Procedure: RIGHT TOTAL KNEE ARTHROPLASTY;  Surgeon: Tobi Bastos, MD;  Location: [facility];  Service: Orthopedics;  Laterality: Right;  . TOTAL LARYNGECTOMY  06-17-2001   DONE AT [facility]    Current Outpatient Prescriptions:  .  albuterol (PROVENTIL) (2.5 MG/3ML) 0.083% nebulizer solution, Take 2.5 mg by nebulization every 6 (six) hours as needed for wheezing or shortness of breath., Disp: , Rfl:  .  amLODipine (NORVASC) 5 MG tablet, Take 5 mg by mouth every  morning., Disp: , Rfl:  .  benazepril (LOTENSIN) 40 MG tablet, Take 40 mg by mouth 2 (two) times daily., Disp: , Rfl:  .  budesonide (PULMICORT) 0.25 MG/2ML nebulizer solution, Take 0.25 mg by nebulization every morning., Disp: , Rfl:  .  cloNIDine (CATAPRES) 0.1 MG tablet, Take 0.1 mg by mouth at bedtime., Disp: , Rfl:  .  ferrous sulfate 325 (65 FE) MG tablet, Take 1 tablet (325 mg total) by mouth 3 (three) times daily after meals., Disp:  .  furosemide (LASIX) 40 MG tablet, Take  20 mg by mouth daily. TAKES 1/2 TAB EVERY AM, Disp: , Rfl:  .  glipiZIDE (GLUCOTROL XL) 10 MG 24 hr tablet, Take 10 mg by mouth every evening., Disp: , Rfl:  .  labetalol (NORMODYNE) 300 MG tablet, Take 300 mg by mouth 2 (two) times daily., Disp: , Rfl:  .  levothyroxine (SYNTHROID, LEVOTHROID) 100 MCG tablet, Take 100 mcg by mouth daily before breakfast., Disp: ,  .  metFORMIN (GLUCOPHAGE) 500 MG tablet, Take 1,000 mg by mouth 2 (two) times daily with a meal., Disp: , Rfl:  .  methocarbamol (ROBAXIN) 500 MG tablet, Take 1 tablet (500 mg total) by mouth every 6 (six) hours as needed for muscle spasms., Disp: 40 tablet, Rfl: 1 .  oxyCODONE-acetaminophen (PERCOCET/ROXICET) 5-325 MG per tablet, Take 1-2 tablets by mouth every 4 (four) hours as needed for moderate pain., Disp: 60 tablet, Rfl: 0 .  pravastatin (PRAVACHOL) 20 MG tablet, Take 20 mg by mouth at bedtime., Disp: , Rfl:  .  predniSONE (DELTASONE) 2.5 MG tablet, Take 2.5 mg by mouth 2 (two) times daily. PT STATES HE TAKES FOR ARTHRITIS OF LUNGS AND PREVENTS BLEEDING FROM LUNGS, Disp: , Rfl:  .  rivaroxaban (XARELTO) 10 MG TABS tablet, Take 1 tablet (10 mg total) by mouth daily with breakfast., Disp: 20  .  sodium chloride 0.9 % nebulizer solution, Take 3 mLs by nebulization as needed (Dryness in lungs)., Disp: , Rfl:  .  tamsulosin (FLOMAX) 0.4 MG CAPS capsule, Take 1 capsule (0.4 mg total) by mouth daily., Disp: 20 capsule, Rfl: 0   Allergies  Allergen Reactions  . Ciprofloxacin Anaphylaxis  . Morphine And Related Nausea And Vomiting    Social History  Substance Use Topics  . Smoking status: Former Smoker    Packs/day: 1.00    Years: 25.00    Types: Cigarettes  . Smokeless tobacco: Never Used  . Alcohol use Yes     Comment: rarely       Review of Systems  Constitutional: Negative.   HENT: Negative.   Eyes: Negative.   Respiratory: Positive for shortness of breath and wheezing. Negative for cough, hemoptysis and sputum  production.   Cardiovascular: Negative.   Gastrointestinal: Positive for heartburn. Negative for abdominal pain, blood in stool, constipation, diarrhea, melena, nausea and vomiting.  Genitourinary: Positive for frequency. Negative for dysuria, flank pain, hematuria and urgency.  Musculoskeletal: Positive for back pain, joint pain and myalgias. Negative for falls and neck pain.  Skin: Negative.   Neurological: Negative.   Endo/Heme/Allergies: Negative.   Psychiatric/Behavioral: Negative.     Objective:  Physical Exam  Constitutional: He is oriented to person, place, and time. He appears well-developed. No distress.  Obese  HENT:  Head: Normocephalic and atraumatic.  Right Ear: External ear normal.  Left Ear: External ear normal.  Nose: Nose normal.  Mouth/Throat: Oropharynx is clear and moist.  Trach present  Eyes: Conjunctivae and EOM are  normal.  Neck: Normal range of motion. Neck supple.  Cardiovascular: Normal rate, normal heart sounds and intact distal pulses.  A regularly irregular rhythm present.  Respiratory: Effort normal. No respiratory distress. He has wheezes.  Expiratory wheeze left lung fields greater than right  GI: Soft. Bowel sounds are normal. He exhibits no distension. There is no tenderness.  Musculoskeletal:       Right hip: Normal.       Left hip: Normal.       Right knee: Normal.       Left knee: He exhibits decreased range of motion and swelling. He exhibits no effusion and no erythema. Tenderness found. Medial joint line and lateral joint line tenderness noted.  Incision over right total knee healed with no signs of infection  Neurological: He is alert and oriented to person, place, and time. He has normal strength. No sensory deficit.  Skin: No rash noted. He is not diaphoretic. No erythema.  Psychiatric: He has a normal mood and affect. His behavior is normal.    Vitals Weight: 220 lb Height: 66in Body Surface Area: 2.08 m Body Mass Index:  35.51 kg/m  Pulse: 76 (Regular)  BP: 132/84 (Sitting, Left Arm, Standard)  Imaging Review Plain radiographs demonstrate severe degenerative joint disease of the left knee(s). The overall alignment ismild varus. The bone quality appears to be good for age and reported activity level.  Assessment/Plan:  End stage primary osteoarthritis, left knee   The patient history, physical examination, clinical judgment of the provider and imaging studies are consistent with end stage degenerative joint disease of the left knee(s) and total knee arthroplasty is deemed medically necessary. The treatment options including medical management, injection therapy arthroscopy and arthroplasty were discussed at length. The risks and benefits of total knee arthroplasty were presented and reviewed. The risks due to aseptic loosening, infection, stiffness, patella tracking problems, thromboembolic complications and other imponderables were discussed. The patient acknowledged the explanation, agreed to proceed with the plan and consent was signed. Patient is being admitted for inpatient treatment for surgery, pain control, PT, OT, prophylactic antibiotics, VTE prophylaxis, progressive ambulation and ADL's and discharge planning. The patient is planning to be discharged home with outpatient therapy.   PCP: Dr. Holley Raring Cardio: Marcello Moores, PA-C Pulm: Dr. Welford Roche Home with wife DME: has equipment already Therapy Plans: outpatient therapy at [place] starting 9/24 Other: has trach    Ardeen Jourdain, PA-C

## 2016-12-03 NOTE — Patient Instructions (Addendum)
Ryan Salinas  12/03/2016   Your procedure is scheduled on: 12-10-16  Report to [facility] Main  Entrance Take [facility] Eelevators to 3rd floor to  [place] at 9:00 AM.    Call this number if you have problems the morning of surgery [phone]    Remember: ONLY 1 PERSON MAY GO WITH YOU TO SHORT STAY TO GET  READY MORNING OF [place].  Do not eat food or drink liquids :After Midnight.     Take these medicines the morning of surgery with A SIP OF WATER: Amlodipine (Norvasc), Labetalol (Normodyne), Levothyroxine (Synthroid). You may also bring and use your eyedrops as needed DO NOT TAKE ANY DIABETIC MEDICATIONS DAY OF YOUR SURGERY                               You may not have any metal on your body including hair pins and              piercings  Do not wear jewelry, make-up, lotions, powders or perfumes, deodorant             Men may shave face and neck.   Do not bring valuables to the hospital. [place].  Contacts, dentures or bridgework may not be worn into surgery.  Leave suitcase in the car. After surgery it may be brought to your room.                   Please read over the following fact sheets you were given: _____________________________________________________________________  How to Manage Your Diabetes Before and After Surgery  Why is it important to control my blood sugar before and after surgery? . Improving blood sugar levels before and after surgery helps healing and can limit problems. . A way of improving blood sugar control is eating a healthy diet by: o  Eating less sugar and carbohydrates o  Increasing activity/exercise o  Talking with your doctor about reaching your blood sugar goals . High blood sugars (greater than 180 mg/dL) can raise your risk of infections and slow your recovery, so you will need to focus on controlling your diabetes during the weeks before  surgery. . Make sure that the doctor who takes care of your diabetes knows about your planned surgery including the date and location.  How do I manage my blood sugar before surgery? . Check your blood sugar at least 4 times a day, starting 2 days before surgery, to make sure that the level is not too high or low. o Check your blood sugar the morning of your surgery when you wake up and every 2 hours until you get to the Short Stay unit. . If your blood sugar is less than 70 mg/dL, you will need to treat for low blood sugar: o Do not take insulin. o Treat a low blood sugar (less than 70 mg/dL) with  cup of clear juice (cranberry or apple), 4 glucose tablets, OR glucose gel. o Recheck blood sugar in 15 minutes after treatment (to make sure it is greater than 70 mg/dL). If your blood sugar is not greater than 70 mg/dL on recheck, call [phone] for further instructions. . Report your blood sugar to the short stay nurse when  you get to Short Stay.  . If you are admitted to the hospital after surgery: o Your blood sugar will be checked by the staff and you will probably be given insulin after surgery (instead of oral diabetes medicines) to make sure you have good blood sugar levels. o The goal for blood sugar control after surgery is 80-180 mg/dL.   WHAT DO I DO ABOUT MY DIABETES MEDICATION?  Marland Kitchen Do not take oral diabetes medicines (pills) the morning of surgery.  . THE DAY BEFORE SURGERY, take your usual dose of Metformin, and Januvia (as needed). Donot take your supper dose of Glipizide.         Patient Signature:  Date:   Nurse Signature:  Date:   Reviewed and Endorsed by [facility] Patient Education Committee, August 2015           [facility] - Preparing for Surgery Before surgery, you can play an important role.  Because skin is not sterile, your skin needs to be as free of germs as possible.  You can reduce the number of germs on your skin by washing with CHG (chlorahexidine  gluconate) soap before surgery.  CHG is an antiseptic cleaner which kills germs and bonds with the skin to continue killing germs even after washing. Please DO NOT use if you have an allergy to CHG or antibacterial soaps.  If your skin becomes reddened/irritated stop using the CHG and inform your nurse when you arrive at Short Stay. Do not shave (including legs and underarms) for at least 48 hours prior to the first CHG shower.  You may shave your face/neck. Please follow these instructions carefully:  1.  Shower with CHG Soap the night before surgery and the  morning of Surgery.  2.  If you choose to wash your hair, wash your hair first as usual with your  normal  shampoo.  3.  After you shampoo, rinse your hair and body thoroughly to remove the  shampoo.                           4.  Use CHG as you would any other liquid soap.  You can apply chg directly  to the skin and wash                       Gently with a scrungie or clean washcloth.  5.  Apply the CHG Soap to your body ONLY FROM THE NECK DOWN.   Do not use on face/ open                           Wound or open sores. Avoid contact with eyes, ears mouth and genitals (private parts).                       Wash face,  Genitals (private parts) with your normal soap.             6.  Wash thoroughly, paying special attention to the area where your surgery  will be performed.  7.  Thoroughly rinse your body with warm water from the neck down.  8.  DO NOT shower/wash with your normal soap after using and rinsing off  the CHG Soap.                9.  Pat yourself dry with a clean towel.  10.  Wear clean pajamas.            11.  Place clean sheets on your bed the night of your first shower and do not  sleep with pets. Day of Surgery : Do not apply any lotions/deodorants the morning of surgery.  Please wear clean clothes to the hospital/surgery center.  FAILURE TO FOLLOW THESE INSTRUCTIONS MAY RESULT IN THE CANCELLATION OF YOUR  SURGERY PATIENT SIGNATURE_________________________________  NURSE SIGNATURE__________________________________  ________________________________________________________________________   Adam Phenix  An incentive spirometer is a tool that can help keep your lungs clear and active. This tool measures how well you are filling your lungs with each breath. Taking long deep breaths may help reverse or decrease the chance of developing breathing (pulmonary) problems (especially infection) following:  A long period of time when you are unable to move or be active. BEFORE THE PROCEDURE   If the spirometer includes an indicator to show your best effort, your nurse or respiratory therapist will set it to a desired goal.  If possible, sit up straight or lean slightly forward. Try not to slouch.  Hold the incentive spirometer in an upright position. INSTRUCTIONS FOR USE  1. Sit on the edge of your bed if possible, or sit up as far as you can in bed or on a chair. 2. Hold the incentive spirometer in an upright position. 3. Breathe out normally. 4. Place the mouthpiece in your mouth and seal your lips tightly around it. 5. Breathe in slowly and as deeply as possible, raising the piston or the ball toward the top of the column. 6. Hold your breath for 3-5 seconds or for as long as possible. Allow the piston or ball to fall to the bottom of the column. 7. Remove the mouthpiece from your mouth and breathe out normally. 8. Rest for a few seconds and repeat Steps 1 through 7 at least 10 times every 1-2 hours when you are awake. Take your time and take a few normal breaths between deep breaths. 9. The spirometer may include an indicator to show your best effort. Use the indicator as a goal to work toward during each repetition. 10. After each set of 10 deep breaths, practice coughing to be sure your lungs are clear. If you have an incision (the cut made at the time of surgery), support your  incision when coughing by placing a pillow or rolled up towels firmly against it. Once you are able to get out of bed, walk around indoors and cough well. You may stop using the incentive spirometer when instructed by your caregiver.  RISKS AND COMPLICATIONS  Take your time so you do not get dizzy or light-headed.  If you are in pain, you may need to take or ask for pain medication before doing incentive spirometry. It is harder to take a deep breath if you are having pain. AFTER USE  Rest and breathe slowly and easily.  It can be helpful to keep track of a log of your progress. Your caregiver can provide you with a simple table to help with this. If you are using the spirometer at home, follow these instructions: [place] IF:   You are having difficultly using the spirometer.  You have trouble using the spirometer as often as instructed.  Your pain medication is not giving enough relief while using the spirometer.  You develop fever of 100.5 F (38.1 C) or higher. SEEK IMMEDIATE MEDICAL CARE IF:   You cough up bloody  sputum that had not been present before.  You develop fever of 102 F (38.9 C) or greater.  You develop worsening pain at or near the incision site. MAKE SURE YOU:   Understand these instructions.  Will watch your condition.  Will get help right away if you are not doing well or get worse. Document Released: 07/21/2006 Document Revised: 06/02/2011 Document Reviewed: 09/21/2006 ExitCare Patient Information 2014 Exit[employer], [place].   ________________________________________________________________________  WHAT IS A BLOOD TRANSFUSION? Blood Transfusion Information  A transfusion is the replacement of blood or some of its parts. Blood is made up of multiple cells which provide different functions.  Red blood cells carry oxygen and are used for blood loss replacement.  White blood cells fight against infection.  Platelets control bleeding.  Plasma  helps clot blood.  Other blood products are available for specialized needs, such as hemophilia or other clotting disorders. BEFORE THE TRANSFUSION  Who gives blood for transfusions?   Healthy volunteers who are fully evaluated to make sure their blood is safe. This is blood bank blood. Transfusion therapy is the safest it has ever been in the practice of medicine. Before blood is taken from a donor, a complete history is taken to make sure that person has no history of diseases nor engages in risky social behavior (examples are intravenous drug use or sexual activity with multiple partners). The donor's travel history is screened to minimize risk of transmitting infections, such as malaria. The donated blood is tested for signs of infectious diseases, such as HIV and hepatitis. The blood is then tested to be sure it is compatible with you in order to minimize the chance of a transfusion reaction. If you or a relative donates blood, this is often done in anticipation of surgery and is not appropriate for emergency situations. It takes many days to process the donated blood. RISKS AND COMPLICATIONS Although transfusion therapy is very safe and saves many lives, the main dangers of transfusion include:   Getting an infectious disease.  Developing a transfusion reaction. This is an allergic reaction to something in the blood you were given. Every precaution is taken to prevent this. The decision to have a blood transfusion has been considered carefully by your caregiver before blood is given. Blood is not given unless the benefits outweigh the risks. AFTER THE TRANSFUSION  Right after receiving a blood transfusion, you will usually feel much better and more energetic. This is especially true if your red blood cells have gotten low (anemic). The transfusion raises the level of the red blood cells which carry oxygen, and this usually causes an energy increase.  The nurse administering the transfusion  will monitor you carefully for complications. HOME CARE INSTRUCTIONS  No special instructions are needed after a transfusion. You may find your energy is better. Speak with your caregiver about any limitations on activity for underlying diseases you may have. SEEK MEDICAL CARE IF:   Your condition is not improving after your transfusion.  You develop redness or irritation at the intravenous (IV) site. SEEK IMMEDIATE MEDICAL CARE IF:  Any of the following symptoms occur over the next 12 hours:  Shaking chills.  You have a temperature by mouth above 102 F (38.9 C), not controlled by medicine.  Chest, back, or muscle pain.  People around you feel you are not acting correctly or are confused.  Shortness of breath or difficulty breathing.  Dizziness and fainting.  You get a rash or develop hives.  You have a  decrease in urine output.  Your urine turns a dark color or changes to pink, red, or brown. Any of the following symptoms occur over the next 10 days:  You have a temperature by mouth above 102 F (38.9 C), not controlled by medicine.  Shortness of breath.  Weakness after normal activity.  The white part of the eye turns yellow (jaundice).  You have a decrease in the amount of urine or are urinating less often.  Your urine turns a dark color or changes to pink, red, or brown. Document Released: 03/07/2000 Document Revised: 06/02/2011 Document Reviewed: 10/25/2007 [facility] Patient Information 2014 [place], [place].  _______________________________________________________________________

## 2016-12-04 ENCOUNTER — Encounter (HOSPITAL_COMMUNITY): Payer: Self-pay

## 2016-12-04 ENCOUNTER — Encounter (HOSPITAL_COMMUNITY)
Admission: RE | Admit: 2016-12-04 | Discharge: 2016-12-04 | Disposition: A | Discharge status: Home / Self Care | Payer: Medicare Other | Source: Ambulatory Visit | Attending: Orthopedic Surgery | Admitting: Orthopedic Surgery

## 2016-12-04 DIAGNOSIS — Z01812 Encounter for preprocedural laboratory examination: Secondary | ICD-10-CM | POA: Diagnosis present

## 2016-12-04 DIAGNOSIS — Z01810 Encounter for preprocedural cardiovascular examination: Secondary | ICD-10-CM | POA: Diagnosis not present

## 2016-12-04 DIAGNOSIS — M1712 Unilateral primary osteoarthritis, left knee: Secondary | ICD-10-CM | POA: Diagnosis not present

## 2016-12-04 LAB — COMPREHENSIVE METABOLIC PANEL
ALT: 17 U/L (ref 17–63)
AST: 16 U/L (ref 15–41)
Albumin: 4 g/dL (ref 3.5–5.0)
Alkaline Phosphatase: 74 U/L (ref 38–126)
Anion gap: 9 (ref 5–15)
BUN: 23 mg/dL — ABNORMAL HIGH (ref 6–20)
CO2: 25 mmol/L (ref 22–32)
Calcium: 8.9 mg/dL (ref 8.9–10.3)
Chloride: 106 mmol/L (ref 101–111)
Creatinine, Ser: 1.22 mg/dL (ref 0.61–1.24)
GFR calc Af Amer: >60 mL/min (ref >60)
GFR calc non Af Amer: 54 mL/min — ABNORMAL LOW (ref >60)
Glucose, Bld: 116 mg/dL — ABNORMAL HIGH (ref 65–99)
Potassium: 3.5 mmol/L (ref 3.5–5.1)
Sodium: 140 mmol/L (ref 135–145)
Total Bilirubin: 0.5 mg/dL (ref 0.3–1.2)
Total Protein: 6.9 g/dL (ref 6.5–8.1)

## 2016-12-04 LAB — CBC WITH DIFFERENTIAL/PLATELET
Basophils Absolute: 0 10*3/uL (ref 0.0–0.1)
Basophils Relative: 0 %
Eosinophils Absolute: 0.1 10*3/uL (ref 0.0–0.7)
Eosinophils Relative: 2 %
HCT: 31.8 % — ABNORMAL LOW (ref 39.0–52.0)
Hemoglobin: 10.8 g/dL — ABNORMAL LOW (ref 13.0–17.0)
Lymphocytes Relative: 21 %
Lymphs Abs: 1.5 10*3/uL (ref 0.7–4.0)
MCH: 29.2 pg (ref 26.0–34.0)
MCHC: 34 g/dL (ref 30.0–36.0)
MCV: 85.9 fL (ref 78.0–100.0)
Monocytes Absolute: 0.4 10*3/uL (ref 0.1–1.0)
Monocytes Relative: 5 %
Neutro Abs: 5.3 10*3/uL (ref 1.7–7.7)
Neutrophils Relative %: 72 %
Platelets: 203 10*3/uL (ref 150–400)
RBC: 3.7 MIL/uL — ABNORMAL LOW (ref 4.22–5.81)
RDW: 15.1 % (ref 11.5–15.5)
WBC: 7.3 10*3/uL (ref 4.0–10.5)

## 2016-12-04 LAB — URINALYSIS, ROUTINE W REFLEX MICROSCOPIC
Bilirubin Urine: NEGATIVE
Glucose, UA: NEGATIVE mg/dL
Ketones, ur: NEGATIVE mg/dL
Leukocytes, UA: NEGATIVE
Nitrite: NEGATIVE
Protein, ur: NEGATIVE mg/dL
Specific Gravity, Urine: 1.016 (ref 1.005–1.030)
pH: 5 (ref 5.0–8.0)

## 2016-12-04 LAB — SURGICAL PCR SCREEN
MRSA, PCR: NEGATIVE
Staphylococcus aureus: POSITIVE — AB

## 2016-12-04 LAB — HEMOGLOBIN A1C
Hgb A1c MFr Bld: 7.4 % — ABNORMAL HIGH (ref 4.8–5.6)
Mean Plasma Glucose: 165.68 mg/dL

## 2016-12-04 LAB — GLUCOSE, CAPILLARY: Glucose-Capillary: 108 mg/dL — ABNORMAL HIGH (ref 65–99)

## 2016-12-04 LAB — APTT: aPTT: 32 seconds (ref 24–36)

## 2016-12-04 LAB — PROTIME-INR
INR: 1.01
Prothrombin Time: 13.2 seconds (ref 11.4–15.2)

## 2016-12-04 NOTE — Progress Notes (Signed)
12-04-16 PCR results routed to Dr. Gladstone Lighter for review.   Pt made aware of PCR results, and verbalized understanding. Prescription called to [facility].

## 2016-12-04 NOTE — Progress Notes (Signed)
11-05-16 Office visit from [place], [facility] on chart  11-05-16 Labs CBC w/Diff, BMP, Lipid, Vit D, PSA, Folate, on chart from [facility]  11-05-16 Bilateral Lower Extremity Duplex Arterial Ultrasound, Ankle to Brachial Indices from [facility] on chart

## 2016-12-10 ENCOUNTER — Encounter (HOSPITAL_COMMUNITY): Payer: Self-pay | Admitting: Emergency Medicine

## 2016-12-10 ENCOUNTER — Encounter (HOSPITAL_COMMUNITY)
Admission: RE | Disposition: A | Discharge status: Skilled Nursing Facility | Payer: Self-pay | Source: Ambulatory Visit | Attending: Orthopedic Surgery

## 2016-12-10 ENCOUNTER — Inpatient Hospital Stay (HOSPITAL_COMMUNITY)
Admission: RE | Admit: 2016-12-10 | Discharge: 2016-12-15 | DRG: 470 | Disposition: A | Discharge status: Skilled Nursing Facility | Payer: Medicare Other | Source: Ambulatory Visit | Attending: Orthopedic Surgery | Admitting: Orthopedic Surgery

## 2016-12-10 ENCOUNTER — Inpatient Hospital Stay (HOSPITAL_COMMUNITY): Payer: Medicare Other | Admitting: Anesthesiology

## 2016-12-10 DIAGNOSIS — Z87891 Personal history of nicotine dependence: Secondary | ICD-10-CM | POA: Diagnosis not present

## 2016-12-10 DIAGNOSIS — Z7952 Long term (current) use of systemic steroids: Secondary | ICD-10-CM | POA: Diagnosis not present

## 2016-12-10 DIAGNOSIS — E669 Obesity, unspecified: Secondary | ICD-10-CM | POA: Diagnosis present

## 2016-12-10 DIAGNOSIS — E7800 Pure hypercholesterolemia, unspecified: Secondary | ICD-10-CM | POA: Diagnosis present

## 2016-12-10 DIAGNOSIS — M25562 Pain in left knee: Secondary | ICD-10-CM | POA: Diagnosis present

## 2016-12-10 DIAGNOSIS — Z8521 Personal history of malignant neoplasm of larynx: Secondary | ICD-10-CM

## 2016-12-10 DIAGNOSIS — E119 Type 2 diabetes mellitus without complications: Secondary | ICD-10-CM | POA: Diagnosis present

## 2016-12-10 DIAGNOSIS — Z96652 Presence of left artificial knee joint: Secondary | ICD-10-CM

## 2016-12-10 DIAGNOSIS — Z6835 Body mass index (BMI) 35.0-35.9, adult: Secondary | ICD-10-CM

## 2016-12-10 DIAGNOSIS — E039 Hypothyroidism, unspecified: Secondary | ICD-10-CM | POA: Diagnosis present

## 2016-12-10 DIAGNOSIS — R509 Fever, unspecified: Secondary | ICD-10-CM

## 2016-12-10 DIAGNOSIS — Z96651 Presence of right artificial knee joint: Secondary | ICD-10-CM | POA: Diagnosis present

## 2016-12-10 DIAGNOSIS — J449 Chronic obstructive pulmonary disease, unspecified: Secondary | ICD-10-CM | POA: Diagnosis present

## 2016-12-10 DIAGNOSIS — I10 Essential (primary) hypertension: Secondary | ICD-10-CM | POA: Diagnosis present

## 2016-12-10 DIAGNOSIS — M1712 Unilateral primary osteoarthritis, left knee: Secondary | ICD-10-CM | POA: Diagnosis present

## 2016-12-10 DIAGNOSIS — Z930 Tracheostomy status: Secondary | ICD-10-CM | POA: Diagnosis not present

## 2016-12-10 HISTORY — PX: TOTAL KNEE ARTHROPLASTY: SHX125

## 2016-12-10 LAB — GLUCOSE, CAPILLARY
GLUCOSE-CAPILLARY: 313 mg/dL — AB (ref 65–99)
Glucose-Capillary: 139 mg/dL — ABNORMAL HIGH (ref 65–99)
Glucose-Capillary: 198 mg/dL — ABNORMAL HIGH (ref 65–99)
Glucose-Capillary: 279 mg/dL — ABNORMAL HIGH (ref 65–99)

## 2016-12-10 LAB — TYPE AND SCREEN
ABO/RH(D): A POS
Antibody Screen: NEGATIVE

## 2016-12-10 SURGERY — ARTHROPLASTY, KNEE, TOTAL
Anesthesia: Spinal | Site: Knee | Laterality: Left

## 2016-12-10 MED ORDER — LABETALOL HCL 100 MG PO TABS
300.0000 mg | ORAL_TABLET | Freq: Two times a day (BID) | ORAL | Status: DC
  Administered 2016-12-11 – 2016-12-15 (×9): 300 mg via ORAL
  Filled 2016-12-10 (×9): qty 3

## 2016-12-10 MED ORDER — METHOCARBAMOL 1000 MG/10ML IJ SOLN
500.0000 mg | Freq: Four times a day (QID) | INTRAVENOUS | Status: DC | PRN
  Administered 2016-12-10: 500 mg via INTRAVENOUS
  Filled 2016-12-10: qty 550

## 2016-12-10 MED ORDER — FENTANYL CITRATE (PF) 100 MCG/2ML IJ SOLN
50.0000 ug | INTRAMUSCULAR | Status: DC | PRN
  Administered 2016-12-10: 50 ug via INTRAVENOUS

## 2016-12-10 MED ORDER — HYDROMORPHONE HCL-NACL 0.5-0.9 MG/ML-% IV SOSY
PREFILLED_SYRINGE | INTRAVENOUS | Status: AC
  Filled 2016-12-10: qty 1

## 2016-12-10 MED ORDER — DEXAMETHASONE SODIUM PHOSPHATE 10 MG/ML IJ SOLN
INTRAMUSCULAR | Status: AC
  Filled 2016-12-10: qty 1

## 2016-12-10 MED ORDER — ROPIVACAINE HCL 7.5 MG/ML IJ SOLN
INTRAMUSCULAR | Status: DC | PRN
  Administered 2016-12-10: 20 mL via PERINEURAL

## 2016-12-10 MED ORDER — SODIUM CHLORIDE 0.9 % IJ SOLN
INTRAMUSCULAR | Status: DC | PRN
Start: 2016-12-10 — End: 2016-12-10
  Administered 2016-12-10: 20 mL

## 2016-12-10 MED ORDER — PHENYLEPHRINE HCL 10 MG/ML IJ SOLN
INTRAMUSCULAR | Status: AC
  Filled 2016-12-10: qty 1

## 2016-12-10 MED ORDER — ONDANSETRON HCL 4 MG/2ML IJ SOLN
4.0000 mg | Freq: Four times a day (QID) | INTRAMUSCULAR | Status: DC | PRN
  Administered 2016-12-11: 4 mg via INTRAVENOUS

## 2016-12-10 MED ORDER — MIDAZOLAM HCL 2 MG/2ML IJ SOLN
INTRAMUSCULAR | Status: AC
  Administered 2016-12-10: 1 mg via INTRAVENOUS
  Filled 2016-12-10: qty 2

## 2016-12-10 MED ORDER — FENTANYL CITRATE (PF) 100 MCG/2ML IJ SOLN
INTRAMUSCULAR | Status: AC
  Administered 2016-12-10: 50 ug via INTRAVENOUS
  Filled 2016-12-10: qty 2

## 2016-12-10 MED ORDER — MENTHOL 3 MG MT LOZG: 1.0000 | LOZENGE | OROMUCOSAL | Status: DC | PRN

## 2016-12-10 MED ORDER — PHENOL 1.4 % MT LIQD
1.0000 | OROMUCOSAL | Status: DC | PRN
  Filled 2016-12-10: qty 177

## 2016-12-10 MED ORDER — STERILE WATER FOR IRRIGATION IR SOLN
Status: DC | PRN
  Administered 2016-12-10: 2000 mL

## 2016-12-10 MED ORDER — HYDROMORPHONE HCL-NACL 0.5-0.9 MG/ML-% IV SOSY
0.2500 mg | PREFILLED_SYRINGE | INTRAVENOUS | Status: DC | PRN
  Administered 2016-12-10: 0.25 mg via INTRAVENOUS

## 2016-12-10 MED ORDER — FERROUS SULFATE 325 (65 FE) MG PO TABS
325.0000 mg | ORAL_TABLET | Freq: Three times a day (TID) | ORAL | Status: DC
  Administered 2016-12-11 – 2016-12-15 (×14): 325 mg via ORAL
  Filled 2016-12-10 (×13): qty 1

## 2016-12-10 MED ORDER — LACTATED RINGERS IV SOLN
INTRAVENOUS | Status: DC
  Administered 2016-12-10: 18:00:00 via INTRAVENOUS

## 2016-12-10 MED ORDER — ACETAMINOPHEN 325 MG PO TABS
650.0000 mg | ORAL_TABLET | Freq: Four times a day (QID) | ORAL | Status: DC | PRN
  Administered 2016-12-12: 650 mg via ORAL
  Filled 2016-12-10: qty 2

## 2016-12-10 MED ORDER — SODIUM CHLORIDE 0.9 % IR SOLN
Status: DC | PRN
  Administered 2016-12-10: 1000 mL

## 2016-12-10 MED ORDER — LIDOCAINE-EPINEPHRINE (PF) 1 %-1:200000 IJ SOLN
INTRAMUSCULAR | Status: AC
  Filled 2016-12-10: qty 30

## 2016-12-10 MED ORDER — MIDAZOLAM HCL 5 MG/5ML IJ SOLN
INTRAMUSCULAR | Status: DC | PRN
  Administered 2016-12-10 (×2): 0.5 mg via INTRAVENOUS

## 2016-12-10 MED ORDER — OXYCODONE HCL 5 MG/5ML PO SOLN: 5.0000 mg | Freq: Once | ORAL | Status: DC | PRN

## 2016-12-10 MED ORDER — HYDROCODONE-ACETAMINOPHEN 5-325 MG PO TABS
1.0000 | ORAL_TABLET | ORAL | Status: DC | PRN
  Administered 2016-12-10 – 2016-12-11 (×3): 1 via ORAL
  Administered 2016-12-11 – 2016-12-12 (×4): 2 via ORAL
  Administered 2016-12-12 – 2016-12-15 (×9): 1 via ORAL
  Filled 2016-12-10 (×2): qty 2
  Filled 2016-12-10 (×6): qty 1
  Filled 2016-12-10: qty 2
  Filled 2016-12-10: qty 1
  Filled 2016-12-10: qty 2
  Filled 2016-12-10 (×2): qty 1
  Filled 2016-12-10: qty 2
  Filled 2016-12-10 (×3): qty 1

## 2016-12-10 MED ORDER — FLEET ENEMA 7-19 GM/118ML RE ENEM: 1.0000 | ENEMA | Freq: Once | RECTAL | Status: DC | PRN

## 2016-12-10 MED ORDER — ALBUTEROL SULFATE (2.5 MG/3ML) 0.083% IN NEBU
2.5000 mg | INHALATION_SOLUTION | Freq: Four times a day (QID) | RESPIRATORY_TRACT | Status: DC | PRN
  Administered 2016-12-10: 20:00:00 2.5 mg via RESPIRATORY_TRACT
  Filled 2016-12-10: qty 3

## 2016-12-10 MED ORDER — SODIUM CHLORIDE 0.9 % IJ SOLN
INTRAMUSCULAR | Status: AC
  Filled 2016-12-10: qty 20

## 2016-12-10 MED ORDER — CLONIDINE HCL 0.1 MG PO TABS
0.1000 mg | ORAL_TABLET | Freq: Every day | ORAL | Status: DC
  Administered 2016-12-10 – 2016-12-14 (×5): 0.1 mg via ORAL
  Filled 2016-12-10 (×5): qty 1

## 2016-12-10 MED ORDER — ONDANSETRON HCL 4 MG/2ML IJ SOLN
INTRAMUSCULAR | Status: AC
  Filled 2016-12-10: qty 2

## 2016-12-10 MED ORDER — HYDROMORPHONE HCL-NACL 0.5-0.9 MG/ML-% IV SOSY: 0.5000 mg | PREFILLED_SYRINGE | INTRAVENOUS | Status: DC | PRN

## 2016-12-10 MED ORDER — OXYCODONE-ACETAMINOPHEN 5-325 MG PO TABS: 2.0000 | ORAL_TABLET | ORAL | Status: DC | PRN

## 2016-12-10 MED ORDER — BUPIVACAINE LIPOSOME 1.3 % IJ SUSP
20.0000 mL | Freq: Once | INTRAMUSCULAR | Status: DC
  Filled 2016-12-10: qty 20

## 2016-12-10 MED ORDER — BISACODYL 5 MG PO TBEC
5.0000 mg | DELAYED_RELEASE_TABLET | Freq: Every day | ORAL | Status: DC | PRN
  Administered 2016-12-15: 5 mg via ORAL
  Filled 2016-12-10: qty 1

## 2016-12-10 MED ORDER — METHOCARBAMOL 500 MG PO TABS
500.0000 mg | ORAL_TABLET | Freq: Four times a day (QID) | ORAL | Status: DC | PRN
  Administered 2016-12-11 – 2016-12-15 (×5): 500 mg via ORAL
  Filled 2016-12-10 (×5): qty 1

## 2016-12-10 MED ORDER — ACETAMINOPHEN 650 MG RE SUPP: 650.0000 mg | Freq: Four times a day (QID) | RECTAL | Status: DC | PRN

## 2016-12-10 MED ORDER — INSULIN ASPART 100 UNIT/ML ~~LOC~~ SOLN
4.0000 [IU] | Freq: Once | SUBCUTANEOUS | Status: AC
  Administered 2016-12-10: 4 [IU] via SUBCUTANEOUS

## 2016-12-10 MED ORDER — SODIUM CHLORIDE 0.9 % IV SOLN
1000.0000 mg | INTRAVENOUS | Status: AC
  Administered 2016-12-10: 1000 mg via INTRAVENOUS
  Filled 2016-12-10: qty 1100

## 2016-12-10 MED ORDER — INSULIN ASPART 100 UNIT/ML ~~LOC~~ SOLN
0.0000 [IU] | Freq: Three times a day (TID) | SUBCUTANEOUS | Status: DC
  Administered 2016-12-10: 18:00:00 8 [IU] via SUBCUTANEOUS
  Administered 2016-12-11: 5 [IU] via SUBCUTANEOUS
  Administered 2016-12-11 – 2016-12-12 (×3): 8 [IU] via SUBCUTANEOUS
  Administered 2016-12-12 – 2016-12-13 (×4): 5 [IU] via SUBCUTANEOUS
  Administered 2016-12-13 – 2016-12-14 (×2): 11 [IU] via SUBCUTANEOUS
  Administered 2016-12-14 – 2016-12-15 (×3): 8 [IU] via SUBCUTANEOUS
  Administered 2016-12-15: 14:00:00 11 [IU] via SUBCUTANEOUS

## 2016-12-10 MED ORDER — CEFAZOLIN SODIUM-DEXTROSE 1-4 GM/50ML-% IV SOLN
1.0000 g | Freq: Four times a day (QID) | INTRAVENOUS | Status: AC
  Administered 2016-12-10 – 2016-12-11 (×2): 1 g via INTRAVENOUS
  Filled 2016-12-10 (×2): qty 50

## 2016-12-10 MED ORDER — ONDANSETRON HCL 4 MG/2ML IJ SOLN
INTRAMUSCULAR | Status: DC | PRN
  Administered 2016-12-10: 4 mg via INTRAVENOUS

## 2016-12-10 MED ORDER — MIDAZOLAM HCL 2 MG/2ML IJ SOLN
INTRAMUSCULAR | Status: AC
  Filled 2016-12-10: qty 2

## 2016-12-10 MED ORDER — SODIUM CHLORIDE 0.9 % IV SOLN
INTRAVENOUS | Status: AC
  Filled 2016-12-10: qty 500000

## 2016-12-10 MED ORDER — LACTATED RINGERS IV SOLN
INTRAVENOUS | Status: DC
  Administered 2016-12-10 (×2): via INTRAVENOUS

## 2016-12-10 MED ORDER — CEFAZOLIN SODIUM-DEXTROSE 2-4 GM/100ML-% IV SOLN
2.0000 g | INTRAVENOUS | Status: AC
  Administered 2016-12-10: 2 g via INTRAVENOUS
  Filled 2016-12-10: qty 100

## 2016-12-10 MED ORDER — BENAZEPRIL HCL 10 MG PO TABS
40.0000 mg | ORAL_TABLET | Freq: Two times a day (BID) | ORAL | Status: DC
  Administered 2016-12-11 – 2016-12-15 (×8): 40 mg via ORAL
  Filled 2016-12-10 (×9): qty 4

## 2016-12-10 MED ORDER — ONDANSETRON HCL 4 MG PO TABS: 4.0000 mg | ORAL_TABLET | Freq: Four times a day (QID) | ORAL | Status: DC | PRN

## 2016-12-10 MED ORDER — BUPIVACAINE LIPOSOME 1.3 % IJ SUSP
INTRAMUSCULAR | Status: DC | PRN
  Administered 2016-12-10: 20 mL

## 2016-12-10 MED ORDER — FENTANYL CITRATE (PF) 250 MCG/5ML IJ SOLN
INTRAMUSCULAR | Status: AC
Start: 2016-12-10 — End: ?
  Filled 2016-12-10: qty 5

## 2016-12-10 MED ORDER — PHENYLEPHRINE HCL 10 MG/ML IJ SOLN
INTRAMUSCULAR | Status: DC | PRN
  Administered 2016-12-10: 10 ug/min via INTRAVENOUS

## 2016-12-10 MED ORDER — CHLORHEXIDINE GLUCONATE 4 % EX LIQD: 60.0000 mL | Freq: Once | CUTANEOUS | Status: DC

## 2016-12-10 MED ORDER — POLYETHYLENE GLYCOL 3350 17 G PO PACK
17.0000 g | PACK | Freq: Every day | ORAL | Status: DC | PRN
  Administered 2016-12-13: 11:00:00 17 g via ORAL
  Filled 2016-12-10: qty 1

## 2016-12-10 MED ORDER — RIVAROXABAN 10 MG PO TABS
10.0000 mg | ORAL_TABLET | Freq: Every day | ORAL | Status: DC
  Administered 2016-12-11 – 2016-12-15 (×5): 10 mg via ORAL
  Filled 2016-12-10 (×5): qty 1

## 2016-12-10 MED ORDER — PROPOFOL 10 MG/ML IV BOLUS
INTRAVENOUS | Status: AC
  Filled 2016-12-10: qty 40

## 2016-12-10 MED ORDER — LEVOTHYROXINE SODIUM 100 MCG PO TABS
100.0000 ug | ORAL_TABLET | Freq: Every day | ORAL | Status: DC
  Administered 2016-12-11 – 2016-12-15 (×5): 100 ug via ORAL
  Filled 2016-12-10 (×5): qty 1

## 2016-12-10 MED ORDER — AMLODIPINE BESYLATE 10 MG PO TABS
10.0000 mg | ORAL_TABLET | Freq: Every day | ORAL | Status: DC
Start: 2016-12-11 — End: 2016-12-15
  Administered 2016-12-11 – 2016-12-15 (×4): 10 mg via ORAL
  Filled 2016-12-10 (×5): qty 1

## 2016-12-10 MED ORDER — PANTOPRAZOLE SODIUM 40 MG PO TBEC
40.0000 mg | DELAYED_RELEASE_TABLET | Freq: Every day | ORAL | Status: DC
  Administered 2016-12-10 – 2016-12-15 (×6): 40 mg via ORAL
  Filled 2016-12-10 (×6): qty 1

## 2016-12-10 MED ORDER — FENTANYL CITRATE (PF) 100 MCG/2ML IJ SOLN
INTRAMUSCULAR | Status: DC | PRN
  Administered 2016-12-10: 25 ug via INTRAVENOUS
  Administered 2016-12-10 (×2): 50 ug via INTRAVENOUS
  Administered 2016-12-10: 100 ug via INTRAVENOUS
  Administered 2016-12-10 (×2): 25 ug via INTRAVENOUS

## 2016-12-10 MED ORDER — PROPOFOL 500 MG/50ML IV EMUL
INTRAVENOUS | Status: DC | PRN
  Administered 2016-12-10: 180 mg via INTRAVENOUS

## 2016-12-10 MED ORDER — TAMSULOSIN HCL 0.4 MG PO CAPS
0.4000 mg | ORAL_CAPSULE | Freq: Every day | ORAL | Status: DC
  Administered 2016-12-10 – 2016-12-15 (×6): 0.4 mg via ORAL
  Filled 2016-12-10 (×6): qty 1

## 2016-12-10 MED ORDER — SODIUM CHLORIDE 0.9 % IV SOLN
INTRAVENOUS | Status: DC | PRN
  Administered 2016-12-10: 500 mL

## 2016-12-10 MED ORDER — PREDNISONE 5 MG PO TABS
2.5000 mg | ORAL_TABLET | Freq: Two times a day (BID) | ORAL | Status: DC
  Administered 2016-12-10 – 2016-12-15 (×10): 2.5 mg via ORAL
  Filled 2016-12-10 (×10): qty 1

## 2016-12-10 MED ORDER — FENTANYL CITRATE (PF) 100 MCG/2ML IJ SOLN
INTRAMUSCULAR | Status: AC
  Filled 2016-12-10: qty 2

## 2016-12-10 MED ORDER — PRAVASTATIN SODIUM 20 MG PO TABS
20.0000 mg | ORAL_TABLET | Freq: Every day | ORAL | Status: DC
  Administered 2016-12-10 – 2016-12-14 (×5): 20 mg via ORAL
  Filled 2016-12-10 (×5): qty 1

## 2016-12-10 MED ORDER — MIDAZOLAM HCL 2 MG/2ML IJ SOLN
1.0000 mg | INTRAMUSCULAR | Status: DC | PRN
  Administered 2016-12-10: 1 mg via INTRAVENOUS

## 2016-12-10 MED ORDER — OXYCODONE HCL 5 MG PO TABS: 5.0000 mg | ORAL_TABLET | Freq: Once | ORAL | Status: DC | PRN

## 2016-12-10 MED ORDER — ALUM & MAG HYDROXIDE-SIMETH 200-200-20 MG/5ML PO SUSP: 30.0000 mL | ORAL | Status: DC | PRN

## 2016-12-10 SURGICAL SUPPLY — 62 items
BAG DECANTER FOR FLEXI CONT (MISCELLANEOUS) ×3 IMPLANT
BAG ZIPLOCK 12X15 (MISCELLANEOUS) ×3 IMPLANT
BANDAGE ACE 4X5 VEL STRL LF (GAUZE/BANDAGES/DRESSINGS) ×3 IMPLANT
BANDAGE ACE 6X5 VEL STRL LF (GAUZE/BANDAGES/DRESSINGS) ×3 IMPLANT
BLADE SAG 18X100X1.27 (BLADE) ×3 IMPLANT
BLADE SAW SGTL 11.0X1.19X90.0M (BLADE) ×3 IMPLANT
BNDG GAUZE ELAST 4 BULKY (GAUZE/BANDAGES/DRESSINGS) ×6 IMPLANT
BONE CEMENT GENTAMICIN (Cement) ×6 IMPLANT
CAP KNEE TOTAL 3 SIGMA ×3 IMPLANT
CEMENT BONE GENTAMICIN 40 (Cement) ×2 IMPLANT
CLOSURE WOUND 1/2 X4 (GAUZE/BANDAGES/DRESSINGS) ×2
COVER SURGICAL LIGHT HANDLE (MISCELLANEOUS) ×3 IMPLANT
CUFF TOURN SGL QUICK 34 (TOURNIQUET CUFF) ×2
CUFF TRNQT CYL 34X4X40X1 (TOURNIQUET CUFF) ×1 IMPLANT
DECANTER SPIKE VIAL GLASS SM (MISCELLANEOUS) ×3 IMPLANT
DRAPE INCISE IOBAN 66X45 STRL (DRAPES) IMPLANT
DRAPE U-SHAPE 47X51 STRL (DRAPES) ×3 IMPLANT
DRSG ADAPTIC 3X8 NADH LF (GAUZE/BANDAGES/DRESSINGS) ×3 IMPLANT
DRSG PAD ABDOMINAL 8X10 ST (GAUZE/BANDAGES/DRESSINGS) ×6 IMPLANT
DURAPREP 26ML APPLICATOR (WOUND CARE) ×3 IMPLANT
ELECT REM PT RETURN 15FT ADLT (MISCELLANEOUS) ×3 IMPLANT
EVACUATOR 1/8 PVC DRAIN (DRAIN) ×3 IMPLANT
FACESHIELD WRAPAROUND (MASK) ×3 IMPLANT
GAUZE SPONGE 4X4 12PLY STRL (GAUZE/BANDAGES/DRESSINGS) ×3 IMPLANT
GLOVE BIOGEL PI IND STRL 6.5 (GLOVE) ×1 IMPLANT
GLOVE BIOGEL PI IND STRL 8.5 (GLOVE) ×1 IMPLANT
GLOVE BIOGEL PI INDICATOR 6.5 (GLOVE) ×2
GLOVE BIOGEL PI INDICATOR 8.5 (GLOVE) ×2
GLOVE ECLIPSE 8.0 STRL XLNG CF (GLOVE) ×6 IMPLANT
GLOVE SURG SS PI 6.5 STRL IVOR (GLOVE) ×3 IMPLANT
GOWN STRL REUS W/TWL LRG LVL3 (GOWN DISPOSABLE) ×3 IMPLANT
GOWN STRL REUS W/TWL XL LVL3 (GOWN DISPOSABLE) ×3 IMPLANT
HANDPIECE INTERPULSE COAX TIP (DISPOSABLE) ×2
HEMOSTAT SPONGE AVITENE ULTRA (HEMOSTASIS) ×3 IMPLANT
IMMOBILIZER KNEE 20 (SOFTGOODS) ×3
IMMOBILIZER KNEE 20 THIGH 36 (SOFTGOODS) ×1 IMPLANT
MANIFOLD NEPTUNE II (INSTRUMENTS) ×3 IMPLANT
NEEDLE HYPO 21X1.5 SAFETY (NEEDLE) IMPLANT
NEEDLE HYPO 22GX1.5 SAFETY (NEEDLE) IMPLANT
NS IRRIG 1000ML POUR BTL (IV SOLUTION) IMPLANT
PACK TOTAL KNEE CUSTOM (KITS) ×3 IMPLANT
PADDING CAST COTTON 6X4 STRL (CAST SUPPLIES) ×3 IMPLANT
PENCIL SMOKE EVAC W/HOLSTER (ELECTROSURGICAL) ×3 IMPLANT
POSITIONER SURGICAL ARM (MISCELLANEOUS) ×3 IMPLANT
SET HNDPC FAN SPRY TIP SCT (DISPOSABLE) ×1 IMPLANT
SET PAD KNEE POSITIONER (MISCELLANEOUS) ×3 IMPLANT
SPONGE LAP 18X18 X RAY DECT (DISPOSABLE) IMPLANT
SPONGE SURGIFOAM ABS GEL 12-7 (HEMOSTASIS) ×3 IMPLANT
STRIP CLOSURE SKIN 1/2X4 (GAUZE/BANDAGES/DRESSINGS) ×4 IMPLANT
SUT BONE WAX W31G (SUTURE) IMPLANT
SUT MNCRL AB 4-0 PS2 18 (SUTURE) ×3 IMPLANT
SUT VIC AB 1 CT1 27 (SUTURE) ×4
SUT VIC AB 1 CT1 27XBRD ANTBC (SUTURE) ×2 IMPLANT
SUT VIC AB 2-0 CT1 27 (SUTURE) ×6
SUT VIC AB 2-0 CT1 TAPERPNT 27 (SUTURE) ×3 IMPLANT
SUT VLOC 180 0 24IN GS25 (SUTURE) ×3 IMPLANT
SYR 20CC LL (SYRINGE) ×6 IMPLANT
TOWER CARTRIDGE SMART MIX (DISPOSABLE) ×3 IMPLANT
TRAY FOLEY W/METER SILVER 16FR (SET/KITS/TRAYS/PACK) ×3 IMPLANT
WATER STERILE IRR 1500ML POUR (IV SOLUTION) ×6 IMPLANT
WRAP KNEE MAXI GEL POST OP (GAUZE/BANDAGES/DRESSINGS) ×3 IMPLANT
YANKAUER SUCT BULB TIP 10FT TU (MISCELLANEOUS) ×3 IMPLANT

## 2016-12-10 NOTE — Anesthesia Postprocedure Evaluation (Signed)
Anesthesia Post Note  Patient: Ryan Salinas  Procedure(s) Performed: Procedure(s) (LRB): LEFT TOTAL KNEE ARTHROPLASTY (Left)     Patient location during evaluation: PACU Anesthesia Type: Spinal Level of consciousness: awake, sedated and responds to stimulation Pain management: pain level controlled Vital Signs Assessment: post-procedure vital signs reviewed and stable Respiratory status: spontaneous breathing, nonlabored ventilation, respiratory function stable and patient connected to nasal cannula oxygen Cardiovascular status: blood pressure returned to baseline and stable Postop Assessment: no apparent nausea or vomiting Anesthetic complications: no    Last Vitals:  Vitals:   12/10/16 1355 12/10/16 1415  BP: (!) 165/94 (!) 145/74  Pulse: 82   Resp: 20   Temp: 36.9 C   SpO2: 99%     Last Pain:  Vitals:   12/10/16 1500  TempSrc:   PainSc: Asleep                 Jadarion Halbig,JAMES TERRILL

## 2016-12-10 NOTE — Anesthesia Procedure Notes (Signed)
Procedures

## 2016-12-10 NOTE — Progress Notes (Signed)
Assisted Dr. Tobias Alexander with left, ultrasound guided, adductor canal block. Side rails up, monitors on throughout procedure. See vital signs in flow sheet. Tolerated Procedure well.Placed on fio2 40% trach collar while sedated.

## 2016-12-10 NOTE — Interval H&P Note (Signed)
History and Physical Interval Note:  12/10/2016 10:52 AM  Ryan Salinas  has presented today for surgery, with the diagnosis of Degenerative arthritis Left Knee  The various methods of treatment have been discussed with the patient and family. After consideration of risks, benefits and other options for treatment, the patient has consented to  Procedure(s): LEFT TOTAL KNEE ARTHROPLASTY (Left) as a surgical intervention .  The patient's history has been reviewed, patient examined, no change in status, stable for surgery.  I have reviewed the patient's chart and labs.  Questions were answered to the patient's satisfaction.     [name]

## 2016-12-10 NOTE — Progress Notes (Signed)
12/10/16 Nursing 2145 Merla Riches PA called reg cbg at [phone] tonight. Order received for 4 units novolog insulin.

## 2016-12-10 NOTE — Anesthesia Procedure Notes (Signed)
Procedure Name: Intubation Date/Time: 12/10/2016 11:41 AM Performed by: Glory Buff Pre-anesthesia Checklist: Patient identified, Emergency Drugs available, Suction available and Patient being monitored Patient Re-evaluated:Patient Re-evaluated prior to induction Preoxygenation: Pre-oxygenation with 100% oxygen (Via trach collar) Induction Type: IV induction Tube type: Reinforced Tube size: 6.0 mm Number of attempts: 1 Placement Confirmation: positive ETCO2 Tube secured with: Tape Comments: Patient was intubated through existing trach stoma with 6.0 reinforced ETT, + ETCO2.

## 2016-12-10 NOTE — Anesthesia Preprocedure Evaluation (Addendum)
Anesthesia Evaluation  Patient identified by MRN, date of birth, ID band  Reviewed: Allergy & Precautions, NPO status , Patient's Chart, lab work & pertinent test results  Airway Mallampati: II  TM Distance: <3 FB Neck ROM: Limited    Dental  (+) Edentulous Upper   Pulmonary shortness of breath, COPD, former smoker,  tracheostomy    + decreased breath sounds      Cardiovascular hypertension, + dysrhythmias  Rhythm:Regular Rate:Normal     Neuro/Psych    GI/Hepatic negative GI ROS, Neg liver ROS,   Endo/Other  diabetes  Renal/GU      Musculoskeletal  (+) Arthritis ,   Abdominal (+) + obese,   Peds  Hematology  (+) Blood dyscrasia, anemia ,   Anesthesia Other Findings   Reproductive/Obstetrics                             Anesthesia Physical Anesthesia Plan  ASA: IV  Anesthesia Plan: Spinal   Post-op Pain Management:  Regional for Post-op pain   Induction:   PONV Risk Score and Plan: 2 and Ondansetron and Dexamethasone  Airway Management Planned: Tracheostomy  Additional Equipment:   Intra-op Plan:   Post-operative Plan:   Informed Consent: I have reviewed the patients History and Physical, chart, labs and discussed the procedure including the risks, benefits and alternatives for the proposed anesthesia with the patient or authorized representative who has indicated his/her understanding and acceptance.   Dental advisory given  Plan Discussed with: CRNA  Anesthesia Plan Comments: (Discussed option t try SAB though he has had several back surgeries. Will plan to start c that at thsi time.)       Anesthesia Quick Evaluation

## 2016-12-10 NOTE — Progress Notes (Signed)
RT placed PT on 30% ATC (8 lpm). 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 Sp02 97% (per PT does not use 02 or humidity at home), HR 79, BBS clear- diminished. PT denies suctioning at this time. Emergency equipment at bedside- PT is on bedside pulse ox.

## 2016-12-10 NOTE — Brief Op Note (Signed)
12/10/2016  1:19 PM  PATIENT:  Ryan Salinas  82 y.o. male  PRE-OPERATIVE DIAGNOSIS:  Degenerative arthritis Left Knee,Primary,with Flexion Contractures.  POST-OPERATIVE DIAGNOSIS:  Degenerative arthritis Left Knee,Primary,with Flexion Contractures.  PROCEDURE:  Procedure(s): LEFT TOTAL KNEE ARTHROPLASTY (Left)  SURGEON:  Surgeon(s) and Role:    Latanya Maudlin, MD - Primary  PHYSICIAN ASSISTANT: Ardeen Jourdain PA  ASSISTANTS: Ardeen Jourdain PA  ANESTHESIA:   general  EBL:  No intake/output data recorded.  BLOOD ADMINISTERED:none  DRAINS: (one) Hemovact drain(s) in the Left Knee with  Suction Open   LOCAL MEDICATIONS USED:  OTHER 20cc of Exparel mixed with 20cc of Normal Saline.  SPECIMEN:  No Specimen  DISPOSITION OF SPECIMEN:  N/A  COUNTS:  YES  TOURNIQUET:  * Missing tourniquet times found for documented tourniquets in log:  [id number] *  DICTATION: .Other Dictation: Dictation Number [id number]  PLAN OF CARE: Admit to inpatient   PATIENT DISPOSITION:  Stable in OR   Delay start of Pharmacological VTE agent (>24hrs) due to surgical blood loss or risk of bleeding: yes

## 2016-12-10 NOTE — Anesthesia Procedure Notes (Signed)
Anesthesia Regional Block: Adductor canal block   Pre-Anesthetic Checklist: ,, timeout performed, Correct Patient, Correct Site, Correct Laterality, Correct Procedure, Correct Position, site marked, Risks and benefits discussed,  Surgical consent,  Pre-op evaluation,  At surgeon's request and post-op pain management  Laterality: Left and Lower  Prep: chloraprep       Needles:   Needle Type: Echogenic Stimulator Needle     Needle Length: 9cm  Needle Gauge: 21   Needle insertion depth: 6 cm   Additional Needles:   Procedures:,,,, ultrasound used (permanent image in chart),,,,  Narrative:  Start time: 12/10/2016 10:30 AM End time: 12/10/2016 10:48 AM Injection made incrementally with aspirations every 5 mL.  Performed by: Personally  Anesthesiologist: Guiliana Shor

## 2016-12-10 NOTE — Interval H&P Note (Signed)
History and Physical Interval Note:  12/10/2016 10:51 AM  Ryan Salinas  has presented today for surgery, with the diagnosis of Degenerative arthritis Left Knee  The various methods of treatment have been discussed with the patient and family. After consideration of risks, benefits and other options for treatment, the patient has consented to  Procedure(s): LEFT TOTAL KNEE ARTHROPLASTY (Left) as a surgical intervention .  The patient's history has been reviewed, patient examined, no change in status, stable for surgery.  I have reviewed the patient's chart and labs.  Questions were answered to the patient's satisfaction.     Yeilin Zweber A

## 2016-12-10 NOTE — Transfer of Care (Signed)
Immediate Anesthesia Transfer of Care Note  Patient: Ryan Salinas  Procedure(s) Performed: Procedure(s): LEFT TOTAL KNEE ARTHROPLASTY (Left)  Patient Location: PACU  Anesthesia Type:General  Level of Consciousness: awake, alert  and oriented  Airway & Oxygen Therapy: Patient Spontanous Breathing and Patient connected to tracheostomy mask oxygen  Post-op Assessment: Report given to RN and Post -op Vital signs reviewed and stable  Post vital signs: Reviewed and stable  Last Vitals:  Vitals:   12/10/16 1107 12/10/16 1108  BP:    Pulse: 76 77  Resp: 14 16  Temp:    SpO2: 100% 100%    Last Pain:  Vitals:   12/10/16 1017  TempSrc:   PainSc: 5       Patients Stated Pain Goal: 3 (94/07/68 0881)  Complications: No apparent anesthesia complications

## 2016-12-11 LAB — BASIC METABOLIC PANEL
ANION GAP: 9 (ref 5–15)
BUN: 23 mg/dL — ABNORMAL HIGH (ref 6–20)
CHLORIDE: 101 mmol/L (ref 101–111)
CO2: 25 mmol/L (ref 22–32)
Calcium: 8.5 mg/dL — ABNORMAL LOW (ref 8.9–10.3)
Creatinine, Ser: 1.31 mg/dL — ABNORMAL HIGH (ref 0.61–1.24)
GFR calc non Af Amer: 50 mL/min — ABNORMAL LOW (ref >60)
GFR, EST AFRICAN AMERICAN: 58 mL/min — AB (ref >60)
Glucose, Bld: 313 mg/dL — ABNORMAL HIGH (ref 65–99)
Potassium: 3.9 mmol/L (ref 3.5–5.1)
SODIUM: 135 mmol/L (ref 135–145)

## 2016-12-11 LAB — GLUCOSE, CAPILLARY
GLUCOSE-CAPILLARY: 241 mg/dL — AB (ref 65–99)
Glucose-Capillary: 254 mg/dL — ABNORMAL HIGH (ref 65–99)
Glucose-Capillary: 252 mg/dL — ABNORMAL HIGH (ref 65–99)
Glucose-Capillary: 215 mg/dL — ABNORMAL HIGH (ref 65–99)

## 2016-12-11 LAB — CBC
HEMATOCRIT: 28.8 % — AB (ref 39.0–52.0)
HEMOGLOBIN: 10 g/dL — AB (ref 13.0–17.0)
MCH: 29.2 pg (ref 26.0–34.0)
MCHC: 34.7 g/dL (ref 30.0–36.0)
MCV: 84.2 fL (ref 78.0–100.0)
Platelets: 191 10*3/uL (ref 150–400)
RBC: 3.42 MIL/uL — AB (ref 4.22–5.81)
RDW: 14.8 % (ref 11.5–15.5)
WBC: 13.3 10*3/uL — ABNORMAL HIGH (ref 4.0–10.5)

## 2016-12-11 MED ORDER — METHOCARBAMOL 500 MG PO TABS: 500.0000 mg | ORAL_TABLET | Freq: Four times a day (QID) | ORAL | 0 refills | Status: AC | PRN

## 2016-12-11 MED ORDER — HYDROCODONE-ACETAMINOPHEN 5-325 MG PO TABS: 1.0000 | ORAL_TABLET | ORAL | 0 refills | Status: AC | PRN

## 2016-12-11 MED ORDER — ASPIRIN EC 325 MG PO TBEC: 325.0000 mg | DELAYED_RELEASE_TABLET | Freq: Two times a day (BID) | ORAL | 0 refills | Status: AC

## 2016-12-11 MED ORDER — FUROSEMIDE 20 MG PO TABS
20.0000 mg | ORAL_TABLET | Freq: Once | ORAL | Status: AC
  Administered 2016-12-11: 20 mg via ORAL
  Filled 2016-12-11: qty 1

## 2016-12-11 NOTE — Evaluation (Signed)
Occupational Therapy Evaluation Patient Details Name: Ryan Salinas MRN: [id number] DOB: [birth year] Today's Date: 12/11/2016    History of Present Illness Pt is an 82 year old male s/p L TKA with hxo f R TKA, tracheostomy, COPD, multiple nodules of lungs   Clinical Impression   Pt is s/p TKA resulting in the deficits listed below (see OT Problem List).  Pt will benefit from skilled OT to increase their safety and independence with ADL and functional mobility for ADL to facilitate discharge to venue listed below.       Follow Up Recommendations  No OT follow up    Equipment Recommendations  None recommended by OT    Recommendations for Other Services       Precautions / Restrictions Precautions Precautions: Fall;Knee Required Braces or Orthoses: Knee Immobilizer - Left Restrictions Other Position/Activity Restrictions: WBAT      Mobility Bed Mobility Overal bed mobility: Needs Assistance Bed Mobility: Supine to Sit     Supine to sit: Min guard;HOB elevated     General bed mobility comments: increased time and effort however no physical assist required, pt utilized bed rail  Transfers Overall transfer level: Needs assistance Equipment used: Rolling walker (2 wheeled) Transfers: Sit to/from Stand Sit to Stand: Min assist;From elevated surface         General transfer comment: verbal cues for UE and LE positioning        ADL either performed or assessed with clinical judgement   ADL Overall ADL's : Needs assistance/impaired Eating/Feeding: Set up;Sitting   Grooming: Set up;Sitting   Upper Body Bathing: Set up;Sitting   Lower Body Bathing: Moderate assistance;Sit to/from stand;Cueing for sequencing;Cueing for safety Lower Body Bathing Details (indicate cue type and reason): posterior lean with standing Upper Body Dressing : Set up;Sitting   Lower Body Dressing: Moderate assistance;Sit to/from stand;Cueing for sequencing;Cueing for safety Lower Body  Dressing Details (indicate cue type and reason): posterior lean with sit to stand Toilet Transfer: Minimal assistance;RW;BSC                   Vision Patient Visual Report: No change from baseline              Pertinent Vitals/Pain Pain Assessment: 0-10 Pain Score: 3  Pain Location: L knee Pain Descriptors / Indicators: Sore;Aching Pain Intervention(s): Monitored during session     Hand Dominance     Extremity/Trunk Assessment Upper Extremity Assessment Upper Extremity Assessment: Overall WFL for tasks assessed   Lower Extremity Assessment Lower Extremity Assessment: LLE deficits/detail LLE Deficits / Details: unable to perform SLR, fair quad contraction, AAROM L knee flexion 85*       Communication Communication Communication: Tracheostomy   Cognition Arousal/Alertness: Awake/alert Behavior During Therapy: WFL for tasks assessed/performed Overall Cognitive Status: Within Functional Limits for tasks assessed                                           Shoulder Instructions      Home Living Family/patient expects to be discharged to:: Private residence Living Arrangements: Spouse/significant other   Type of Home: House Home Access: Stairs to enter Technical brewer of Steps: 1.5 Entrance Stairs-Rails: None Home Layout: Able to live on main level with bedroom/bathroom     Bathroom Shower/Tub: Walk-in shower         Home Equipment: Environmental consultant - 2 wheels;Cane - single point  Prior Functioning/Environment Level of Independence: Independent with assistive device(s)                 OT Problem List: Decreased strength;Decreased knowledge of use of DME or AE      OT Treatment/Interventions: Self-care/ADL training;Patient/family education    OT Goals(Current goals can be found in the care plan section) Acute Rehab OT Goals Patient Stated Goal: home tomorrow OT Goal Formulation: With patient Time For Goal Achievement:  12/18/16 Potential to Achieve Goals: Good  OT Frequency: Min 2X/week   Barriers to D/C:            Co-evaluation              AM-PAC PT "6 Clicks" Daily Activity     Outcome Measure Help from another person eating meals?: None Help from another person taking care of personal grooming?: None Help from another person toileting, which includes using toliet, bedpan, or urinal?: A Little Help from another person bathing (including washing, rinsing, drying)?: A Little Help from another person to put on and taking off regular upper body clothing?: None Help from another person to put on and taking off regular lower body clothing?: A Little 6 Click Score: 21   End of Session Equipment Utilized During Treatment: Rolling walker Nurse Communication: Mobility status  Activity Tolerance: Patient tolerated treatment well Patient left: in chair  OT Visit Diagnosis: Unsteadiness on feet (R26.81);Muscle weakness (generalized) (M62.81);Pain Pain - Right/Left: Left Pain - part of body: Knee                Time: 1040-1059 OT Time Calculation (min): 19 min Charges:  OT General Charges $OT Visit: 1 Visit OT Evaluation $OT Eval Low Complexity: 1 Low G-Codes:     Kari Baars, [place]  Payton Mccallum D 12/11/2016, 12:10 PM

## 2016-12-11 NOTE — Progress Notes (Signed)
Lives with spouse in a 2 story home with 2 ste. No DME needs, has a RW and BSC.Scheduled to start OP PT at [facility] on 12-15-16. Home with OPPT. [phone]

## 2016-12-11 NOTE — Evaluation (Signed)
Physical Therapy Evaluation Patient Details Name: Ryan Salinas MRN: [id number] DOB: 01-Jul-1934 Today's Date: 12/11/2016   History of Present Illness  Pt is an 82 year old male s/p L TKA with hxo f R TKA, tracheostomy, COPD, multiple nodules of lungs  Clinical Impression  Pt is s/p TKA resulting in the deficits listed below (see PT Problem List).  Pt will benefit from skilled PT to increase their independence and safety with mobility to allow discharge to the venue listed below.  Pt requiring min assist for mobility and only able to tolerate ambulating 8 feet on evaluation.  Pt reports he is typically able to ambulate household distances however sometimes requires rest breaks due to SOB.  SpO2 99-100% on room after ambulating today (pt reports his SpO2 is typically good, he just becomes SOB with activity at baseline).  Pt may benefit from HHPT prior to starting with OP PT, as pt reports plan at this time is for OP PT on Monday.     Follow Up Recommendations Home health PT    Equipment Recommendations  None recommended by PT    Recommendations for Other Services       Precautions / Restrictions Precautions Precautions: Fall;Knee Required Braces or Orthoses: Knee Immobilizer - Left Restrictions Weight Bearing Restrictions: No Other Position/Activity Restrictions: WBAT      Mobility  Bed Mobility Overal bed mobility: Needs Assistance Bed Mobility: Supine to Sit     Supine to sit: Min guard;HOB elevated     General bed mobility comments: increased time and effort however no physical assist required, pt utilized bed rail  Transfers Overall transfer level: Needs assistance Equipment used: Rolling walker (2 wheeled) Transfers: Sit to/from Stand Sit to Stand: Min assist;From elevated surface         General transfer comment: verbal cues for UE and LE positioning  Ambulation/Gait Ambulation/Gait assistance: Min assist Ambulation Distance (Feet): 8 Feet Assistive device:  Rolling walker (2 wheeled) Gait Pattern/deviations: Step-to pattern;Decreased stance time - left;Antalgic     General Gait Details: verbal cues for sequence, RW positioning, step length, posture, required recliner following due to pt quick to fatigue  Stairs            Wheelchair Mobility    Modified Rankin (Stroke Patients Only)       Balance                                             Pertinent Vitals/Pain Pain Assessment: 0-10 Pain Score: 4  Pain Location: L knee Pain Descriptors / Indicators: Sore;Aching Pain Intervention(s): Limited activity within patient's tolerance;Monitored during session;Repositioned;Ice applied    Home Living Family/patient expects to be discharged to:: Private residence Living Arrangements: Spouse/significant other   Type of Home: House Home Access: Stairs to enter Entrance Stairs-Rails: None Entrance Stairs-Number of Steps: 1.5 Home Layout: Able to live on main level with bedroom/bathroom Home Equipment: Walker - 2 wheels;Cane - single point      Prior Function Level of Independence: Independent with assistive device(s)               Hand Dominance        Extremity/Trunk Assessment        Lower Extremity Assessment Lower Extremity Assessment: LLE deficits/detail LLE Deficits / Details: unable to perform SLR, fair quad contraction, AAROM L knee flexion 85*  Communication   Communication: Tracheostomy  Cognition Arousal/Alertness: Awake/alert Behavior During Therapy: WFL for tasks assessed/performed Overall Cognitive Status: Within Functional Limits for tasks assessed                                        General Comments      Exercises Total Joint Exercises Ankle Circles/Pumps: AROM;Both;10 reps Quad Sets: AROM;Both;10 reps Short Arc Quad: AAROM;10 reps;Left Heel Slides: AAROM;Left;10 reps Hip ABduction/ADduction: AAROM;Left;10 reps Straight Leg Raises:  AAROM;Left;10 reps   Assessment/Plan    PT Assessment Patient needs continued PT services  PT Problem List Decreased strength;Decreased range of motion;Decreased mobility;Pain;Decreased knowledge of use of DME;Decreased activity tolerance       PT Treatment Interventions Functional mobility training;Stair training;Therapeutic exercise;Gait training;Patient/family education;DME instruction;Therapeutic activities    PT Goals (Current goals can be found in the Care Plan section)  Acute Rehab PT Goals PT Goal Formulation: With patient Time For Goal Achievement: 12/16/16 Potential to Achieve Goals: Good    Frequency 7X/week   Barriers to discharge        Co-evaluation               AM-PAC PT "6 Clicks" Daily Activity  Outcome Measure Difficulty turning over in bed (including adjusting bedclothes, sheets and blankets)?: A Little Difficulty moving from lying on back to sitting on the side of the bed? : A Lot Difficulty sitting down on and standing up from a chair with arms (e.g., wheelchair, bedside commode, etc,.)?: Unable Help needed moving to and from a bed to chair (including a wheelchair)?: A Little Help needed walking in hospital room?: A Little Help needed climbing 3-5 steps with a railing? : A Lot 6 Click Score: 14    End of Session Equipment Utilized During Treatment: Gait belt;Left knee immobilizer Activity Tolerance: Patient limited by fatigue Patient left: in chair;with call bell/phone within reach;with nursing/sitter in room   PT Visit Diagnosis: Difficulty in walking, not elsewhere classified (R26.2);Pain Pain - Right/Left: Left Pain - part of body: Knee    Time: 1950-9326 PT Time Calculation (min) (ACUTE ONLY): 28 min   Charges:   PT Evaluation $PT Eval Low Complexity: 1 Low PT Treatments $Therapeutic Exercise: 8-22 mins   PT G Codes:        Carmelia Bake, PT, DPT 12/11/2016 Pager: [phone]  [name] 12/11/2016, 11:36 AM

## 2016-12-11 NOTE — Progress Notes (Signed)
PA aware of low output, and said to watch it.

## 2016-12-11 NOTE — Discharge Instructions (Signed)

## 2016-12-11 NOTE — Op Note (Signed)
NAMESHRIHAAN, PORZIO                ACCOUNT NO.:  [id number]  MEDICAL RECORD NO.:  [id number]  LOCATION:                                 FACILITY:  PHYSICIAN:  Kipp Brood. Tvisha Schwoerer, M.D.DATE OF BIRTH:  1934/05/23  DATE OF PROCEDURE:  12/10/2016 DATE OF DISCHARGE:                              OPERATIVE REPORT   SURGEON:  Kipp Brood. Gladstone Lighter, [name]  ASSISTANT:  Ardeen Jourdain, [place]  PREOPERATIVE DIAGNOSES: 1. Primary osteoarthritis of the left knee with bone-on-bone. 2. Flexion contractures, left knee.  POSTOPERATIVE DIAGNOSES: 1. Primary osteoarthritis of the left knee with bone-on-bone. 2. Flexion contractures, left knee.  OPERATION:  Left total knee arthroplasty utilizing the DePuy system. All three components were cemented.  The sizes used was a size 4 right femoral component posterior cruciate sacrificing type.  Tibial tray was a size 4.  The insert was a size 4, 12.5-mm thickness.  The patella was a size 38 with 3 pegs.  DESCRIPTION OF PROCEDURE:  Under general anesthesia, routine orthopedic prep and draping of the right lower extremity was carried out.  The anesthesiologist first tried a spinal, was unable to do that, so we converted to a general anesthetic.  Prior to that, he had an adductor block in the left leg.  After the time-out, sterile prep and draping was carried out.  The leg was exsanguinated with an Esmarch and tourniquet was elevated to 325 mmHg.  At this time, the knee was placed in a DeMayo knee holder.  The knee was flexed and at this point, an anterior approach to the knee was carried out.  Two flaps were created.  At this time, I then carried out a median parapatellar incision, I reflected the patella laterally and a medial and lateral meniscectomy was carried out, the knee was extremely tight.  He had quite edematous leg as well.  At this point, we excised the anterior, posterior, cruciate, and medial and lateral menisci.  We then freed up all the scar  tissue of the knee.  We then made our initial drill hole in the intercondylar notch.  The canal finder was inserted to make sure we were in the canal and we were.  We removed the canal finder and then irrigated out the canal.  We then removed 12-mm thickness off the distal femur because of extreme contracture of the knee.  Following that, we measured the femur to be a size 4 left and we then made our anterior, posterior and chamfering cuts for a size 4 left femoral component.  At this particular point, we then prepared our tibial plateau in the usual fashion.  Drill hole was made in the plateau.  The canal finder was inserted.  We then removed 6-mm thickness off the affected medial side of the tibia.  At this particular time, we then inserted our spacer blocks after we did insert our lamina spreaders.  We selected a 12.5-mm spacer block.  At this point, we then irrigated the knee out, flexed the knee and then went on and prepared our keel cut out of the proximal tibial plateau and we did our cut out of the distal femur.  Following that, the  trial components were inserted, size 4 tibial tray, size 4 femoral component as well with a 12- mm thickness insert.  We then did our resurfacing procedure on the patella, three drill holes were made in the patella in usual fashion. The patella was measured to be a size 38.  All trial components were removed.  We then water picked out the knee and then cemented all three components in simultaneously.  After the cement was hard, we removed all loose pieces of cement, we irrigated out the knee again.  At this particular point, I then inserted my permanent rotating platform size 4, 12.5-mm thickness.  We reduced the knee and had excellent extension and flexion.  We irrigated out the knee again and then inserted our Hemovac drain and closed the knee in layers in the usual fashion.  Note preop, the patient had 2 g of IV Ancef, 1 g of tranexamic acid.  At the  end of the procedure, we injected 20 mL of Exparel with 20 mL of normal saline. Sterile dressings were applied.  He will be admitted and placed on anticoagulants as well.          ______________________________ Kipp Brood. Gladstone Lighter, [name]     RAG/MEDQ  D:  12/10/2016  T:  12/11/2016  Job:  [id number]

## 2016-12-11 NOTE — Progress Notes (Signed)
12/11/16 2045 Nursing Patient voiding 50cc at a time of urine. Bladder scanned at this time for 0 urine. Patient has no sensation of fullness or pain in the bladder area. Will continue to monitor patient.

## 2016-12-11 NOTE — Progress Notes (Signed)
Physical Therapy Treatment Patient Details Name: Ryan Salinas MRN: [id number] DOB: 04/26/1934 Today's Date: 12/11/2016    History of Present Illness Pt is an 82 year old male s/p L TKA with hxo f R TKA, tracheostomy, COPD, multiple nodules of lungs    PT Comments    Pt assisted with ambulating in hallway and then back to bed.     Follow Up Recommendations  Home health PT     Equipment Recommendations  None recommended by PT    Recommendations for Other Services       Precautions / Restrictions Precautions Precautions: Fall;Knee Required Braces or Orthoses: Knee Immobilizer - Left Restrictions Other Position/Activity Restrictions: WBAT    Mobility  Bed Mobility Overal bed mobility: Needs Assistance Bed Mobility: Sit to Supine     Supine to sit: Min guard;HOB elevated Sit to supine: Min guard   General bed mobility comments: increased time and effort however no physical assist required  Transfers Overall transfer level: Needs assistance Equipment used: Rolling walker (2 wheeled) Transfers: Sit to/from Stand Sit to Stand: Min assist         General transfer comment: verbal cues for UE and LE positioning  Ambulation/Gait Ambulation/Gait assistance: Min guard Ambulation Distance (Feet): 40 Feet Assistive device: Rolling walker (2 wheeled) Gait Pattern/deviations: Step-to pattern;Decreased stance time - left;Antalgic     General Gait Details: verbal cues for sequence, RW positioning, step length, posture, 4 standing rest breaks due to SOB/fatigue   Stairs            Wheelchair Mobility    Modified Rankin (Stroke Patients Only)       Balance                                            Cognition Arousal/Alertness: Awake/alert Behavior During Therapy: WFL for tasks assessed/performed Overall Cognitive Status: Within Functional Limits for tasks assessed                                        Exercises      General Comments        Pertinent Vitals/Pain Pain Assessment: 0-10 Pain Score: 4  Pain Location: L knee Pain Descriptors / Indicators: Sore;Aching Pain Intervention(s): Limited activity within patient's tolerance;Repositioned;Monitored during session    Home Living Family/patient expects to be discharged to:: Private residence Living Arrangements: Spouse/significant other   Type of Home: House Home Access: Stairs to enter Entrance Stairs-Rails: None Home Layout: Able to live on main level with bedroom/bathroom Home Equipment: Environmental consultant - 2 wheels;Cane - single point      Prior Function Level of Independence: Independent with assistive device(s)          PT Goals (current goals can now be found in the care plan section) Acute Rehab PT Goals Patient Stated Goal: home tomorrow PT Goal Formulation: With patient Time For Goal Achievement: 12/16/16 Potential to Achieve Goals: Good Progress towards PT goals: Progressing toward goals    Frequency    7X/week      PT Plan Current plan remains appropriate    Co-evaluation              AM-PAC PT "6 Clicks" Daily Activity  Outcome Measure  Difficulty turning over in bed (including adjusting bedclothes, sheets and blankets)?: A  Little Difficulty moving from lying on back to sitting on the side of the bed? : A Lot Difficulty sitting down on and standing up from a chair with arms (e.g., wheelchair, bedside commode, etc,.)?: Unable Help needed moving to and from a bed to chair (including a wheelchair)?: A Little Help needed walking in hospital room?: A Little Help needed climbing 3-5 steps with a railing? : A Lot 6 Click Score: 14    End of Session Equipment Utilized During Treatment: Left knee immobilizer;Gait belt Activity Tolerance: Patient tolerated treatment well Patient left: with call bell/phone within reach;in bed   PT Visit Diagnosis: Difficulty in walking, not elsewhere classified (R26.2);Pain Pain -  Right/Left: Left Pain - part of body: Knee     Time: 1315-1340 PT Time Calculation (min) (ACUTE ONLY): 25 min  Charges:  $Gait Training: 23-37 mins                    G Codes:      Carmelia Bake, PT, DPT 12/11/2016 Pager: [phone]    [name] 12/11/2016, 3:11 PM

## 2016-12-11 NOTE — Progress Notes (Signed)
Subjective: 1 Day Post-Op Procedure(s) (LRB): LEFT TOTAL KNEE ARTHROPLASTY (Left) Patient reports pain as 2 on 0-10 scale.    Objective: Vital signs in last 24 hours: Temp:  [97.4 F (36.3 C)-98.6 F (37 C)] 98.1 F (36.7 C) (09/20 0555) Pulse Rate:  [74-91] 88 (09/20 0555) Resp:  [13-24] 16 (09/20 0555) BP: (96-169)/(64-94) 156/86 (09/20 0555) SpO2:  [95 %-100 %] 100 % (09/20 0555) FiO2 (%):  [30 %] 30 % (09/20 0313) Weight:  [101.2 kg (223 lb)] 101.2 kg (223 lb) (09/19 0909)  Intake/Output from previous day: 09/19 0701 - 09/20 0700 In: 3485 [P.O.:60; I.V.:2465; IV Piggyback:160] Out: 9147 [Urine:3440; Blood:20] Intake/Output this shift: Total I/O In: 1675 [P.O.:60; I.V.:815; Other:800] Out: 1500 [Urine:1500]   Recent Labs  12/11/16 0510  HGB 10.0*    Recent Labs  12/11/16 0510  WBC 13.3*  RBC 3.42*  HCT 28.8*  PLT 191    Recent Labs  12/11/16 0510  NA 135  K 3.9  CL 101  CO2 25  BUN 23*  CREATININE 1.31*  GLUCOSE 313*  CALCIUM 8.5*   No results for input(s): LABPT, INR in the last 72 hours.  Neurovascular intact Dorsiflexion/Plantar flexion intact No cellulitis present  Assessment/Plan: 1 Day Post-Op Procedure(s) (LRB): LEFT TOTAL KNEE ARTHROPLASTY (Left) Up with therapy  Ryan Salinas A 12/11/2016, 6:59 AM

## 2016-12-12 LAB — BASIC METABOLIC PANEL
ANION GAP: 10 (ref 5–15)
BUN: 20 mg/dL (ref 6–20)
CHLORIDE: 99 mmol/L — AB (ref 101–111)
CO2: 25 mmol/L (ref 22–32)
Calcium: 8.3 mg/dL — ABNORMAL LOW (ref 8.9–10.3)
Creatinine, Ser: 1.28 mg/dL — ABNORMAL HIGH (ref 0.61–1.24)
GFR, EST AFRICAN AMERICAN: 59 mL/min — AB (ref >60)
GFR, EST NON AFRICAN AMERICAN: 51 mL/min — AB (ref >60)
Glucose, Bld: 254 mg/dL — ABNORMAL HIGH (ref 65–99)
POTASSIUM: 3.4 mmol/L — AB (ref 3.5–5.1)
SODIUM: 134 mmol/L — AB (ref 135–145)

## 2016-12-12 LAB — CBC
HCT: 26.5 % — ABNORMAL LOW (ref 39.0–52.0)
HEMOGLOBIN: 9.1 g/dL — AB (ref 13.0–17.0)
MCH: 28.6 pg (ref 26.0–34.0)
MCHC: 34.3 g/dL (ref 30.0–36.0)
MCV: 83.3 fL (ref 78.0–100.0)
PLATELETS: 179 10*3/uL (ref 150–400)
RBC: 3.18 MIL/uL — AB (ref 4.22–5.81)
RDW: 14.9 % (ref 11.5–15.5)
WBC: 11.7 10*3/uL — AB (ref 4.0–10.5)

## 2016-12-12 LAB — GLUCOSE, CAPILLARY
GLUCOSE-CAPILLARY: 235 mg/dL — AB (ref 65–99)
GLUCOSE-CAPILLARY: 224 mg/dL — AB (ref 65–99)
Glucose-Capillary: 254 mg/dL — ABNORMAL HIGH (ref 65–99)
Glucose-Capillary: 238 mg/dL — ABNORMAL HIGH (ref 65–99)

## 2016-12-12 NOTE — Progress Notes (Signed)
Physical Therapy Treatment Patient Details Name: Ryan Salinas MRN: [id number] DOB: 1934/11/01 Today's Date: 12/12/2016    History of Present Illness Pt is an 82 year old male s/p L TKA with hxo f R TKA, tracheostomy, COPD, multiple nodules of lungs    PT Comments    Pt requiring increased assist for transfers today.  Pt reports desire to ambulate and attempt stairs however did not feel these were safe at this time.  Pt denies dizziness or increase in pain limiting mobility, just c/o increased weakness today. RN notified   Follow Up Recommendations  Home health PT     Equipment Recommendations  None recommended by PT    Recommendations for Other Services       Precautions / Restrictions Precautions Precautions: Fall;Knee Required Braces or Orthoses: Knee Immobilizer - Left Restrictions Other Position/Activity Restrictions: WBAT    Mobility  Bed Mobility Overal bed mobility: Needs Assistance Bed Mobility: Supine to Sit     Supine to sit: Min assist;HOB elevated     General bed mobility comments: increased time and effort, assist for L LE  Transfers Overall transfer level: Needs assistance Equipment used: Rolling walker (2 wheeled) Transfers: Sit to/from [employer] Sit to Stand: Mod assist Stand pivot transfers: Mod assist       General transfer comment: verbal cues for UE and LE positioning, pt requiring increased physical assist today,  attempted to sit to stand x4 to get briefs and shorts pulled up, then transferred to recliner  Ambulation/Gait             General Gait Details: deferred due to fatigue and increased assist with transfer   Stairs            Wheelchair Mobility    Modified Rankin (Stroke Patients Only)       Balance                                            Cognition Arousal/Alertness: Awake/alert Behavior During Therapy: WFL for tasks assessed/performed Overall Cognitive Status:  Within Functional Limits for tasks assessed                                        Exercises      General Comments        Pertinent Vitals/Pain Pain Assessment: 0-10 Pain Score: 4  Pain Location: L knee Pain Descriptors / Indicators: Sore;Aching Pain Intervention(s): Limited activity within patient's tolerance;Monitored during session;Repositioned    Home Living                      Prior Function            PT Goals (current goals can now be found in the care plan section) Progress towards PT goals: Not progressing toward goals - comment (increased weakness and assist required today)    Frequency    7X/week      PT Plan Current plan remains appropriate    Co-evaluation              AM-PAC PT "6 Clicks" Daily Activity  Outcome Measure  Difficulty turning over in bed (including adjusting bedclothes, sheets and blankets)?: A Lot Difficulty moving from lying on back to sitting on the side of the bed? :  Unable Difficulty sitting down on and standing up from a chair with arms (e.g., wheelchair, bedside commode, etc,.)?: Unable Help needed moving to and from a bed to chair (including a wheelchair)?: A Lot Help needed walking in hospital room?: Total Help needed climbing 3-5 steps with a railing? : Total 6 Click Score: 8    End of Session Equipment Utilized During Treatment: Left knee immobilizer;Gait belt Activity Tolerance: Patient limited by fatigue Patient left: with call bell/phone within reach;in chair Nurse Communication: Mobility status PT Visit Diagnosis: Difficulty in walking, not elsewhere classified (R26.2);Pain Pain - Right/Left: Left Pain - part of body: Knee     Time: 0165-5374 PT Time Calculation (min) (ACUTE ONLY): 23 min  Charges:  $Therapeutic Activity: 23-37 mins                    G Codes:       Carmelia Bake, PT, DPT 12/12/2016 Pager: [phone]  [name] 12/12/2016, 1:07 PM

## 2016-12-12 NOTE — Progress Notes (Signed)
12/11/16 Nursing 2200 Patient's cbg 241 at bedtime. Patient refused insulin coverage tonight

## 2016-12-12 NOTE — Progress Notes (Signed)
Subjective: 2 Days Post-Op Procedure(s) (LRB): LEFT TOTAL KNEE ARTHROPLASTY (Left) Patient reports pain as 1 on 0-10 scaledOING VERY WELL TODAY..    Objective: Vital signs in last 24 hours: Temp:  [98.5 F (36.9 C)-99.8 F (37.7 C)] 99.7 F (37.6 C) (09/21 0544) Pulse Rate:  [76-97] 94 (09/21 0544) Resp:  [16-20] 19 (09/21 0544) BP: (130-168)/(75-87) 159/75 (09/21 0544) SpO2:  [93 %-100 %] 93 % (09/21 0544)  Intake/Output from previous day: 09/20 0701 - 09/21 0700 In: 360 [P.O.:180; I.V.:180] Out: 795 [Urine:795] Intake/Output this shift: No intake/output data recorded.   Recent Labs  12/11/16 0510 12/12/16 0518  HGB 10.0* 9.1*    Recent Labs  12/11/16 0510 12/12/16 0518  WBC 13.3* 11.7*  RBC 3.42* 3.18*  HCT 28.8* 26.5*  PLT 191 179    Recent Labs  12/11/16 0510 12/12/16 0518  NA 135 134*  K 3.9 3.4*  CL 101 99*  CO2 25 25  BUN 23* 20  CREATININE 1.31* 1.28*  GLUCOSE 313* 254*  CALCIUM 8.5* 8.3*   No results for input(s): LABPT, INR in the last 72 hours.  Neurologically intact Dorsiflexion/Plantar flexion intact  Assessment/Plan: 2 Days Post-Op Procedure(s) (LRB): LEFT TOTAL KNEE ARTHROPLASTY (Left) Up with therapy discontinued TODAY.  Jamilia Jacques A 12/12/2016, 7:34 AM

## 2016-12-12 NOTE — Progress Notes (Signed)
Occupational Therapy Treatment Patient Details Name: Ryan Salinas MRN: [id number] DOB: 01-27-1935 Today's Date: 12/12/2016    History of present illness Pt is an 82 year old male s/p L TKA with hxo f R TKA, tracheostomy, COPD, multiple nodules of lungs   OT comments  Pt did not do as well this day.  RN aware. Pts BP after standing with OT 110/46  Follow Up Recommendations  No OT follow up    Equipment Recommendations  None recommended by OT    Recommendations for Other Services      Precautions / Restrictions         Mobility Bed Mobility               General bed mobility comments: pt in chair  Transfers Overall transfer level: Needs assistance Equipment used: Rolling walker (2 wheeled) Transfers: Sit to/from [employer] Transfers Sit to Stand: Mod assist Stand pivot transfers: Mod assist                ADL either performed or assessed with clinical judgement   ADL               Lower Body Bathing: Moderate assistance;Sit to/from stand;Cueing for sequencing;Cueing for safety;Cueing for compensatory techniques       Lower Body Dressing: Moderate assistance;Sit to/from stand;Cueing for sequencing;Cueing for safety       Toileting- Clothing Manipulation and Hygiene: Moderate assistance;Sit to/from stand;Cueing for safety;Cueing for sequencing;Cueing for compensatory techniques Toileting - Clothing Manipulation Details (indicate cue type and reason): to use urinal       General ADL Comments: pt not feeling well- pts BP 110/46. RN aware               Cognition Arousal/Alertness: Awake/alert Behavior During Therapy: WFL for tasks assessed/performed Overall Cognitive Status: Within Functional Limits for tasks assessed                                                     Pertinent Vitals/ Pain       Pain Score: 4  Pain Descriptors / Indicators: Sore;Aching Pain Intervention(s): Limited activity within patient's  tolerance;Repositioned;Ice applied         Frequency  Min 2X/week        Progress Toward Goals  OT Goals(current goals can now be found in the care plan section)  Progress towards OT goals: OT to reassess next treatment     Plan Discharge plan remains appropriate    Co-evaluation                 AM-PAC PT "6 Clicks" Daily Activity     Outcome Measure   Help from another person eating meals?: None Help from another person taking care of personal grooming?: None Help from another person toileting, which includes using toliet, bedpan, or urinal?: A Little Help from another person bathing (including washing, rinsing, drying)?: A Little Help from another person to put on and taking off regular upper body clothing?: None Help from another person to put on and taking off regular lower body clothing?: A Little 6 Click Score: 21    End of Session Equipment Utilized During Treatment: Rolling walker  OT Visit Diagnosis: Unsteadiness on feet (R26.81);Muscle weakness (generalized) (M62.81);Pain Pain - Right/Left: Left Pain - part of body: Knee   Activity Tolerance Patient  limited by fatigue;Treatment limited secondary to medical complications (Comment) (low BP)   Patient Left in chair   Nurse Communication Mobility status        Time: 9024-0973 OT Time Calculation (min): 13 min  Charges: OT General Charges $OT Visit: 1 Visit OT Treatments $Self Care/Home Management : 8-22 mins  [place], [place] Pierre Part   Betsy Pries 12/12/2016, 11:28 AM

## 2016-12-12 NOTE — Progress Notes (Signed)
Physical Therapy Treatment Patient Details Name: Ryan Salinas MRN: [id number] DOB: 1934-05-21 Today's Date: 12/12/2016    History of Present Illness Pt is an 82 year old male s/p L TKA with hxo f R TKA, tracheostomy, COPD, multiple nodules of lungs    PT Comments    Pt's mobility improved since this morning however pt remains high fall risk if to d/c home today.  Pt able to ambulate short distance however fatigues quickly.  Pt requiring increased assist for stairs.  Spouse does not feel comfortable assisting pt home at this time.  Pt would benefit from HHPT prior to outpatient due to decreased endurance and difficulty with stairs.  Pt needs to practice safe stair technique again prior to d/c.   Follow Up Recommendations  Home health PT     Equipment Recommendations  None recommended by PT    Recommendations for Other Services       Precautions / Restrictions Precautions Precautions: Fall;Knee Required Braces or Orthoses: Knee Immobilizer - Left Restrictions Other Position/Activity Restrictions: WBAT    Mobility  Bed Mobility Overal bed mobility: Needs Assistance Bed Mobility: Supine to Sit     Supine to sit: Min assist;HOB elevated     General bed mobility comments: pt up in recliner  Transfers Overall transfer level: Needs assistance Equipment used: Rolling walker (2 wheeled) Transfers: Sit to/from Stand Sit to Stand: Min assist Stand pivot transfers: Mod assist       General transfer comment: verbal cues for UE and LE positioning, assist for L LE forward and rise  Ambulation/Gait Ambulation/Gait assistance: Min guard Ambulation Distance (Feet): 30 Feet Assistive device: Rolling walker (2 wheeled) Gait Pattern/deviations: Step-to pattern;Decreased stance time - left;Antalgic     General Gait Details: deferred due to fatigue and increased assist with transfer   Stairs Stairs: Yes   Stair Management: Step to pattern;Forwards;Two rails Number of Stairs:  2 General stair comments: verbal cues for sequence and safety, pt wished to perform forwards however daughter reports pt does not have rails at home (storm door and cane is how pt performs prior to surgery), pt attempting to perform backwards with RW (safer and more supported way) however too fatigued and weak from previous mobility and requiring more assist so recliner brought behind pt for rest break, after rest break pt wished to ambulate back to room  Wheelchair Mobility    Modified Rankin (Stroke Patients Only)       Balance                                            Cognition Arousal/Alertness: Awake/alert Behavior During Therapy: WFL for tasks assessed/performed Overall Cognitive Status: Within Functional Limits for tasks assessed                                        Exercises Total Joint Exercises Quad Sets: AROM;Both;10 reps Heel Slides: AAROM;Left;10 reps    General Comments        Pertinent Vitals/Pain Pain Assessment: 0-10 Pain Score: 4  Pain Location: L knee Pain Descriptors / Indicators: Sore;Aching Pain Intervention(s): Repositioned;Limited activity within patient's tolerance;Premedicated before session;Ice applied;Monitored during session    Home Living  Prior Function            PT Goals (current goals can now be found in the care plan section) Progress towards PT goals: Progressing toward goals    Frequency    7X/week      PT Plan Current plan remains appropriate    Co-evaluation              AM-PAC PT "6 Clicks" Daily Activity  Outcome Measure  Difficulty turning over in bed (including adjusting bedclothes, sheets and blankets)?: A Lot Difficulty moving from lying on back to sitting on the side of the bed? : Unable Difficulty sitting down on and standing up from a chair with arms (e.g., wheelchair, bedside commode, etc,.)?: Unable Help needed moving to and from a  bed to chair (including a wheelchair)?: A Little Help needed walking in hospital room?: A Little Help needed climbing 3-5 steps with a railing? : Total 6 Click Score: 11    End of Session Equipment Utilized During Treatment: Left knee immobilizer;Gait belt Activity Tolerance: Patient limited by fatigue Patient left: in chair;with call bell/phone within reach;with family/visitor present Nurse Communication: Mobility status PT Visit Diagnosis: Difficulty in walking, not elsewhere classified (R26.2);Pain Pain - Right/Left: Left Pain - part of body: Knee     Time: 5456-2563 PT Time Calculation (min) (ACUTE ONLY): 28 min  Charges:  $Gait Training: 23-37 mins                    G Codes:       Carmelia Bake, PT, DPT 12/12/2016 Pager: [phone]  [name] 12/12/2016, 3:49 PM

## 2016-12-12 NOTE — Progress Notes (Signed)
12/12/16 [place] PA called reg patients elevated temp 101.2 at 2230. Patient c/o of green mucus tonight.  Order received for 2 view chest xray in am

## 2016-12-13 ENCOUNTER — Inpatient Hospital Stay (HOSPITAL_COMMUNITY): Payer: Medicare Other

## 2016-12-13 LAB — GLUCOSE, CAPILLARY
GLUCOSE-CAPILLARY: 320 mg/dL — AB (ref 65–99)
GLUCOSE-CAPILLARY: 241 mg/dL — AB (ref 65–99)
Glucose-Capillary: 218 mg/dL — ABNORMAL HIGH (ref 65–99)
Glucose-Capillary: 270 mg/dL — ABNORMAL HIGH (ref 65–99)

## 2016-12-13 LAB — CBC
HCT: 25.9 % — ABNORMAL LOW (ref 39.0–52.0)
Hemoglobin: 8.9 g/dL — ABNORMAL LOW (ref 13.0–17.0)
MCH: 29.3 pg (ref 26.0–34.0)
MCHC: 34.4 g/dL (ref 30.0–36.0)
MCV: 85.2 fL (ref 78.0–100.0)
PLATELETS: 169 10*3/uL (ref 150–400)
RBC: 3.04 MIL/uL — AB (ref 4.22–5.81)
RDW: 15.1 % (ref 11.5–15.5)
WBC: 12.2 10*3/uL — AB (ref 4.0–10.5)

## 2016-12-13 NOTE — Progress Notes (Signed)
Physical Therapy Treatment Patient Details Name: Ryan Salinas MRN: [id number] DOB: 1935-02-18 Today's Date: 12/13/2016    History of Present Illness Pt is an 82 year old male s/p L TKA with hxo f R TKA, tracheostomy, COPD, multiple nodules of lungs    PT Comments    Pt fatigues quickly and only able to tolerate 10 feet.  Pt attempted to perform steps however too weak and required recliner.  Vitals: 140/49 mmHg, 99% room air, 92 bpm, 99.7 *F temp.  Pt attempted to stand and ambulate x2 however would immediately return to sitting after standing.  Updated d/c recommendation to SNF.  If home, would strongly recommend HHPT for pt safety. (current MD d/c plan is for outpatient)   Follow Up Recommendations  SNF     Equipment Recommendations  None recommended by PT    Recommendations for Other Services       Precautions / Restrictions Precautions Precautions: Fall;Knee Required Braces or Orthoses: Knee Immobilizer - Left Restrictions Other Position/Activity Restrictions: WBAT    Mobility  Bed Mobility Overal bed mobility: Needs Assistance Bed Mobility: Supine to Sit     Supine to sit: Min assist;HOB elevated     General bed mobility comments: assist for L LE  Transfers Overall transfer level: Needs assistance Equipment used: Rolling walker (2 wheeled) Transfers: Sit to/from Stand Sit to Stand: Mod assist         General transfer comment: verbal cues for UE and LE positioning, assist to rise and steady as well as control descent  Ambulation/Gait Ambulation/Gait assistance: Min assist Ambulation Distance (Feet): 10 Feet Assistive device: Rolling walker (2 wheeled) Gait Pattern/deviations: Step-to pattern;Decreased stance time - left;Antalgic     General Gait Details: verbal cues for upright posture, fatigues very quickly, recliner following   Stairs         General stair comments: positioned backward at step however pt too weak too perform so pulled recliner  close for pt to rest  Wheelchair Mobility    Modified Rankin (Stroke Patients Only)       Balance                                            Cognition Arousal/Alertness: Awake/alert Behavior During Therapy: WFL for tasks assessed/performed Overall Cognitive Status: Within Functional Limits for tasks assessed                                        Exercises      General Comments        Pertinent Vitals/Pain Pain Assessment: 0-10 Pain Score: 5  Pain Location: L knee Pain Descriptors / Indicators: Sore;Aching Pain Intervention(s): Limited activity within patient's tolerance;Monitored during session;Repositioned;Ice applied    Home Living                      Prior Function            PT Goals (current goals can now be found in the care plan section) Progress towards PT goals: Progressing toward goals    Frequency    7X/week      PT Plan Discharge plan needs to be updated    Co-evaluation              AM-PAC PT "6  Clicks" Daily Activity  Outcome Measure  Difficulty turning over in bed (including adjusting bedclothes, sheets and blankets)?: Unable Difficulty moving from lying on back to sitting on the side of the bed? : Unable Difficulty sitting down on and standing up from a chair with arms (e.g., wheelchair, bedside commode, etc,.)?: Unable Help needed moving to and from a bed to chair (including a wheelchair)?: A Lot Help needed walking in hospital room?: A Lot Help needed climbing 3-5 steps with a railing? : Total 6 Click Score: 8    End of Session Equipment Utilized During Treatment: Left knee immobilizer;Gait belt Activity Tolerance: Patient limited by fatigue Patient left: in chair;with call bell/phone within reach;with family/visitor present   PT Visit Diagnosis: Difficulty in walking, not elsewhere classified (R26.2);Pain Pain - Right/Left: Left Pain - part of body: Knee     Time:  1120-1145 PT Time Calculation (min) (ACUTE ONLY): 25 min  Charges:  $Gait Training: 8-22 mins $Therapeutic Activity: 8-22 mins                    G Codes:       Carmelia Bake, PT, DPT 12/13/2016 Pager: [phone]   [name] 12/13/2016, 1:40 PM

## 2016-12-13 NOTE — Progress Notes (Signed)
Ryan Salinas  MRN: [id number] DOB/Age: [birth year] 80 y.o. [facility] Procedure: Procedure(s) (LRB): LEFT TOTAL KNEE ARTHROPLASTY (Left)     Subjective: Has had trouble with bed mobility and slow to ambulate with PT. Temp yesterday to 101.2 and low grade this am, awaiting transport down this am for CXR. Denies cough , CP. Voiding and passing flatus but no BM   Vital Signs Temp:  [98.6 F (37 C)-101.2 F (38.4 C)] 99.6 F (37.6 C) (09/22 0626) Pulse Rate:  [90-109] 96 (09/22 0626) Resp:  [16-18] 16 (09/22 0626) BP: (110-173)/(46-78) 160/72 (09/22 0626) SpO2:  [92 %-98 %] 97 % (09/22 0626)  Lab Results  Recent Labs  12/12/16 0518 12/13/16 0543  WBC 11.7* 12.2*  HGB 9.1* 8.9*  HCT 26.5* 25.9*  PLT 179 169   BMET  Recent Labs  12/11/16 0510 12/12/16 0518  NA 135 134*  K 3.9 3.4*  CL 101 99*  CO2 25 25  GLUCOSE 313* 254*  BUN 23* 20  CREATININE 1.31* 1.28*  CALCIUM 8.5* 8.3*   INR  Date Value Ref Range Status  12/04/2016 1.01  Final     Exam Holds left knee flexed at 90 out of "comfort" Dressing changed Breath and bowel sounds decreased Incision dry without erythema or drainage        Plan CXR this am miralax upon return Continue attempts to mobilize Needs some additional work until safe to go home and also would like to see his bowels moving.  Ryan Aprea PA-C  12/13/2016, 9:07 AM Contact # [phone]

## 2016-12-13 NOTE — Progress Notes (Signed)
Patient set up on room air humidification per pt request. Currently on 5L 21% FIO2.

## 2016-12-13 NOTE — Progress Notes (Signed)
Physical Therapy Treatment Patient Details Name: Ryan Salinas MRN: [id number] DOB: 1934/12/22 Today's Date: 12/13/2016    History of Present Illness Pt is an 82 year old male s/p L TKA with hxo f R TKA, tracheostomy, COPD, multiple nodules of lungs    PT Comments    Pt again attempting to ambulate and practice stair technique however pt has limited endurance and unable to perform step due to weakness.  Pt performed knee flexion and extension exercises.  Pt is a high fall risk and does not appear safe to d/c home.   Also do not feel family could provide required assist level at this time. Recommend SNF upon d/c.     Follow Up Recommendations  SNF     Equipment Recommendations  None recommended by PT    Recommendations for Other Services       Precautions / Restrictions Precautions Precautions: Fall;Knee Required Braces or Orthoses: Knee Immobilizer - Left Restrictions Other Position/Activity Restrictions: WBAT    Mobility  Bed Mobility Overal bed mobility: Needs Assistance Bed Mobility: Supine to Sit     Supine to sit: Min assist;HOB elevated     General bed mobility comments: assist for L LE  Transfers Overall transfer level: Needs assistance Equipment used: Rolling walker (2 wheeled) Transfers: Sit to/from Stand Sit to Stand: Mod assist         General transfer comment: verbal cues for UE and LE positioning, assist to rise and steady as well as control descent  Ambulation/Gait Ambulation/Gait assistance: Min assist Ambulation Distance (Feet): 10 Feet Assistive device: Rolling walker (2 wheeled) Gait Pattern/deviations: Step-to pattern;Decreased stance time - left;Antalgic     General Gait Details: verbal cues for upright posture, fatigues very quickly, recliner following   Stairs         General stair comments: positioned backward at step however pt again too weak too perform so pulled recliner close for pt to rest  Wheelchair Mobility     Modified Rankin (Stroke Patients Only)       Balance                                            Cognition Arousal/Alertness: Awake/alert Behavior During Therapy: WFL for tasks assessed/performed Overall Cognitive Status: Within Functional Limits for tasks assessed                                        Exercises Total Joint Exercises Quad Sets: AROM;Both;10 reps Heel Slides: AAROM;Left;10 reps    General Comments        Pertinent Vitals/Pain Pain Assessment: 0-10 Pain Score: 4  Pain Location: L knee Pain Descriptors / Indicators: Sore;Aching Pain Intervention(s): Limited activity within patient's tolerance;Monitored during session;Repositioned    Home Living                      Prior Function            PT Goals (current goals can now be found in the care plan section) Progress towards PT goals: Progressing toward goals    Frequency    7X/week      PT Plan Current plan remains appropriate    Co-evaluation              AM-PAC PT "6  Clicks" Daily Activity  Outcome Measure  Difficulty turning over in bed (including adjusting bedclothes, sheets and blankets)?: Unable Difficulty moving from lying on back to sitting on the side of the bed? : Unable Difficulty sitting down on and standing up from a chair with arms (e.g., wheelchair, bedside commode, etc,.)?: Unable Help needed moving to and from a bed to chair (including a wheelchair)?: A Lot Help needed walking in hospital room?: A Lot Help needed climbing 3-5 steps with a railing? : Total 6 Click Score: 8    End of Session Equipment Utilized During Treatment: Left knee immobilizer;Gait belt Activity Tolerance: Patient limited by fatigue Patient left: in chair;with call bell/phone within reach;with family/visitor present   PT Visit Diagnosis: Difficulty in walking, not elsewhere classified (R26.2);Pain Pain - Right/Left: Left Pain - part of body:  Knee     Time: 1941-7408 PT Time Calculation (min) (ACUTE ONLY): 27 min  Charges:  $Gait Training: 8-22 mins $Therapeutic Activity: 8-22 mins                    G Codes:       Carmelia Bake, PT, DPT 12/13/2016 Pager: [phone]  [name] 12/13/2016, 4:54 PM

## 2016-12-13 NOTE — NC FL2 (Signed)
[place] LEVEL OF CARE SCREENING TOOL     IDENTIFICATION  Patient Name: Ryan Salinas Birthdate: June 01, 1934 Sex: male Admission Date (Current Location): 12/10/2016  [facility] and [place] Number:  Herbalist and Address:  [facility],  [place] [address], [place]      Provider Number: [id number]  Attending Physician Name and Address:  Latanya Maudlin, MD  Relative Name and Phone Number:       Current Level of Care: Hospital Recommended Level of Care: [place] Prior Approval Number:    Date Approved/Denied:   PASRR Number:   [id number]   Discharge Plan: SNF    Current Diagnoses: Patient Active Problem List   Diagnosis Date Noted  . History of total knee arthroplasty, left 12/10/2016  . Total knee replacement status 11/30/2013  . Osteoarthritis of right knee 05/25/2013  . Lateral meniscus tear 05/25/2013    Orientation RESPIRATION BLADDER Height & Weight     Self, Time, Situation, Place  Normal Continent Weight: 223 lb (101.2 kg) Height:  5\' 6"  (167.6 cm)  BEHAVIORAL SYMPTOMS/MOOD NEUROLOGICAL BOWEL NUTRITION STATUS      Continent Diet (Carb modified )  AMBULATORY STATUS COMMUNICATION OF NEEDS Skin   Limited Assist Verbally Surgical wounds (2 closed incisions right knee)                       Personal Care Assistance Level of Assistance  Bathing, Feeding Bathing Assistance: Limited assistance Feeding assistance: Independent Dressing Assistance: Limited assistance     Functional Limitations Info             SPECIAL CARE FACTORS FREQUENCY  PT (By licensed PT), OT (By licensed OT)     PT Frequency: 5 OT Frequency: 5            Contractures      Additional Factors Info  Code Status, Allergies Code Status Info: Full Code  Allergies Info: CIPROFLOXACIN, MORPHINE AND RELATED, BUPRENORPHINE HCL            Current Medications (12/13/2016):  This is the current hospital active  medication list Current Facility-Administered Medications  Medication Dose Route Frequency Provider Last Rate Last Dose  . acetaminophen (TYLENOL) tablet 650 mg  650 mg Oral Q6H PRN Latanya Maudlin, MD   650 mg at 12/12/16 2226   Or  . acetaminophen (TYLENOL) suppository 650 mg  650 mg Rectal Q6H PRN Latanya Maudlin, MD      . albuterol (PROVENTIL) (2.5 MG/3ML) 0.083% nebulizer solution 2.5 mg  2.5 mg Nebulization Q6H PRN Latanya Maudlin, MD   2.5 mg at 12/10/16 1935  . alum & mag hydroxide-simeth (MAALOX/MYLANTA) 200-200-20 MG/5ML suspension 30 mL  30 mL Oral Q4H PRN Latanya Maudlin, MD      . amLODipine (NORVASC) tablet 10 mg  10 mg Oral Daily Latanya Maudlin, MD   10 mg at 12/13/16 1053  . benazepril (LOTENSIN) tablet 40 mg  40 mg Oral BID Latanya Maudlin, MD   40 mg at 12/13/16 1053  . bisacodyl (DULCOLAX) EC tablet 5 mg  5 mg Oral Daily PRN Latanya Maudlin, MD      . bupivacaine liposome (EXPAREL) 1.3 % injection 266 mg  20 mL Infiltration Once [place], [employer], PA-C      . cloNIDine (CATAPRES) tablet 0.1 mg  0.1 mg Oral QHS Latanya Maudlin, MD   0.1 mg at 12/12/16 2226  . ferrous sulfate tablet 325 mg  325 mg Oral  TID Penelope Galas, MD   325 mg at 12/13/16 1240  . HYDROcodone-acetaminophen (NORCO/VICODIN) 5-325 MG per tablet 1-2 tablet  1-2 tablet Oral Q4H PRN Latanya Maudlin, MD   1 tablet at 12/12/16 2238  . HYDROmorphone (DILAUDID) injection 0.5 mg  0.5 mg Intravenous Q2H PRN Latanya Maudlin, MD      . insulin aspart (novoLOG) injection 0-15 Units  0-15 Units Subcutaneous TID WC Latanya Maudlin, MD   11 Units at 12/13/16 1239  . labetalol (NORMODYNE) tablet 300 mg  300 mg Oral BID Latanya Maudlin, MD   300 mg at 12/13/16 1053  . lactated ringers infusion   Intravenous Continuous Latanya Maudlin, MD 100 mL/hr at 12/11/16 0810    . levothyroxine (SYNTHROID, LEVOTHROID) tablet 100 mcg  100 mcg Oral QAC breakfast Latanya Maudlin, MD   100 mcg at 12/13/16 8338  . menthol-cetylpyridinium  (CEPACOL) lozenge 3 mg  1 lozenge Oral PRN Latanya Maudlin, MD       Or  . phenol (CHLORASEPTIC) mouth spray 1 spray  1 spray Mouth/Throat PRN Latanya Maudlin, MD      . methocarbamol (ROBAXIN) tablet 500 mg  500 mg Oral Q6H PRN Latanya Maudlin, MD   500 mg at 12/11/16 1729   Or  . methocarbamol (ROBAXIN) 500 mg in dextrose 5 % 50 mL IVPB  500 mg Intravenous Q6H PRN Latanya Maudlin, MD   Stopped at 12/10/16 1420  . ondansetron (ZOFRAN) tablet 4 mg  4 mg Oral Q6H PRN Latanya Maudlin, MD       Or  . ondansetron (ZOFRAN) injection 4 mg  4 mg Intravenous Q6H PRN Latanya Maudlin, MD   4 mg at 12/11/16 1524  . oxyCODONE-acetaminophen (PERCOCET/ROXICET) 5-325 MG per tablet 2 tablet  2 tablet Oral Q4H PRN Latanya Maudlin, MD      . pantoprazole (PROTONIX) EC tablet 40 mg  40 mg Oral Daily Latanya Maudlin, MD   40 mg at 12/13/16 1053  . polyethylene glycol (MIRALAX / GLYCOLAX) packet 17 g  17 g Oral Daily PRN Latanya Maudlin, MD   17 g at 12/13/16 1054  . pravastatin (PRAVACHOL) tablet 20 mg  20 mg Oral QHS Latanya Maudlin, MD   20 mg at 12/12/16 2226  . predniSONE (DELTASONE) tablet 2.5 mg  2.5 mg Oral BID WC Latanya Maudlin, MD   2.5 mg at 12/13/16 2505  . rivaroxaban (XARELTO) tablet 10 mg  10 mg Oral Q breakfast Latanya Maudlin, MD   10 mg at 12/13/16 3976  . sodium phosphate (FLEET) 7-19 GM/118ML enema 1 enema  1 enema Rectal Once PRN Latanya Maudlin, MD      . tamsulosin [facility]) capsule 0.4 mg  0.4 mg Oral Daily Latanya Maudlin, MD   0.4 mg at 12/13/16 1053     Discharge Medications: Please see discharge summary for a list of discharge medications.  Relevant Imaging Results:  Relevant Lab Results:   Additional Information SS#: [id number]  Weston Anna, LCSW

## 2016-12-13 NOTE — Care Management Note (Signed)
Case Management Note  Patient Details  Name: Ryan Salinas MRN: [id number] Date of Birth: 1935-01-24  Subjective/Objective:    S/p L TKA                Action/Plan: Discharge Planning: NCm spoke to pt, wife and dtr, Ryan Salinas at bedside. Pt was short of breath and having issues with ambulating during PT. PT/OT recommend SNF. Pt's wife is at home but she recently had knee surgery. Dtrs work during the day. Message sent to attending to recommendations to SNF-rehab. CSW referral for SNF.    PCP Guadlupe Spanish   Expected Discharge Date:  12/12/16               Expected Discharge Plan:  Skilled Nursing Facility  In-House Referral:  Clinical Social Work  Discharge planning Services  CM Consult  Post Acute Care Choice:  NA, Home Health Choice offered to:  Patient, Adult Children  DME Arranged:  N/A DME Agency:  NA  HH Arranged:    [place] Agency:     Status of Service:  In process, will continue to follow  If discussed at Long Length of Stay Meetings, dates discussed:    Additional Comments:  Erenest Rasher, RN 12/13/2016, 6:18 PM

## 2016-12-13 NOTE — Progress Notes (Signed)
OT Cancellation Note  Patient Details Name: Ryan Salinas MRN: [id number] DOB: 09-24-1934   Cancelled Treatment:    Reason Eval/Treat Not Completed: Patient at procedure or test/ unavailable - pt off unit at the time OT came to check on patient. Will follow up at a later time.  Takeru Bose A Marla Pouliot 12/13/2016, 9:30 AM

## 2016-12-14 LAB — GLUCOSE, CAPILLARY
GLUCOSE-CAPILLARY: 266 mg/dL — AB (ref 65–99)
GLUCOSE-CAPILLARY: 293 mg/dL — AB (ref 65–99)
Glucose-Capillary: 283 mg/dL — ABNORMAL HIGH (ref 65–99)
Glucose-Capillary: 309 mg/dL — ABNORMAL HIGH (ref 65–99)

## 2016-12-14 NOTE — Clinical Social Work Note (Signed)
Clinical Social Work Assessment  Patient Details  Name: Ryan Salinas MRN: [id number] Date of Birth: 1934-07-18  Date of referral:  12/14/16               Reason for consult:  Facility Placement                Permission sought to share information with:  Case Manager Permission granted to share information::  Yes, Verbal Permission Granted  Name::     Ryan Salinas, [phone]  Agency::  SNF  Relationship::     Contact Information:     Housing/Transportation Living arrangements for the past 2 months:  Single Family Home Source of Information:  Patient Patient Interpreter Needed:  None Criminal Activity/Legal Involvement Pertinent to Current Situation/Hospitalization:  No - Comment as needed Significant Relationships:  Adult Children, Spouse Lives with:  Spouse Do you feel safe going back to the place where you live?  No Need for family participation in patient care:  Yes (Comment)  Care giving concerns:  Pt resides at home with spouse. Pt had left knee replacement and will need SNF at DC. Pt not safe to return home at this time. Pt in agreement with SNF placement.  Social Worker assessment / plan:  CSW met with patient at bedside to discuss clinical teams recommendation for SNF. CSW explained her role.  CSW discussed the SNF process and options. Pt indicated that he wants to go to [place] area. CSW will f/u on SNF's in the area. CSW will f/u on FL2, passr and sending out offers.  Employment status:  Retired Forensic scientist:    PT Recommendations:  [place] / Referral to community resources:  [place]  Patient/Family's Response to care:  Pt appreciative of CSW assistance with placement. No issues identified at this time.   Patient/Family's Understanding of and Emotional Response to Diagnosis, Current Treatment, and Prognosis:  Patient has good understanding of diagnosis, current treatment and prognosis. Pt hopeful to  get back home soon after short term rehab. No issues or concerns identified.  Emotional Assessment Appearance:  Appears stated age Attitude/Demeanor/Rapport:   (Cooperative) Affect (typically observed):  Accepting, Appropriate Orientation:    Alcohol / Substance use:  Not Applicable Psych involvement (Current and /or in the community):  No (Comment)  Discharge Needs  Concerns to be addressed:  Care Coordination Readmission within the last 30 days:  No Current discharge risk:  Physical Impairment, Dependent with Mobility Barriers to Discharge:  No Barriers Identified   Ryan Baxter, LCSW 12/14/2016, 2:11 PM

## 2016-12-14 NOTE — Progress Notes (Signed)
Pt transferred to chair with assist of 2. Sat up in chair for several hours. Family in to visit. Appetite good. PT in to work with pt. Report given to [place].

## 2016-12-14 NOTE — Progress Notes (Signed)
Physical Therapy Treatment Patient Details Name: Ryan Salinas MRN: [id number] DOB: [birth year] Today's Date: 12/14/2016    History of Present Illness Pt is an 82 year old male s/p L TKA with hxo f R TKA, tracheostomy, COPD, multiple nodules of lungs    PT Comments    Pt progressing very slowly; continues to fatigue rapidly, only able to amb ~3' today and required sitting rest d/t DOE and weakness; pt performed TKA exercise protocol with assist; visited pt later per wife request d/t NT unable to amb pt to bathroom; today this is a safety concern, wife insisting this is what pt needs to do based on yesterday's advice from PT staff--advised wife that pt should use BSC or stand for use of urinal for now given pt fall risk; will continue to follow  Follow Up Recommendations   SNF      Equipment Recommendations  None recommended by PT    Recommendations for Other Services       Precautions / Restrictions Precautions Precautions: Fall;Knee Restrictions Weight Bearing Restrictions: No Other Position/Activity Restrictions: WBAT    Mobility  Bed Mobility               General bed mobility comments: OOB in chair  Transfers Overall transfer level: Needs assistance Equipment used: Rolling walker (2 wheeled) Transfers: Sit to/from Stand Sit to Stand: Mod assist         General transfer comment: verbal cues for UE and LE positioning, assist to rise and steady as well as control descent  Ambulation/Gait Ambulation/Gait assistance: Mod assist Ambulation Distance (Feet): 3 Feet Assistive device: Rolling walker (2 wheeled) Gait Pattern/deviations: Step-to pattern;Decreased stance time - left;Antalgic;Trunk flexed     General Gait Details: verbal cues for upright posture, fatigues very quickly, recliner following   Stairs            Wheelchair Mobility    Modified Rankin (Stroke Patients Only)       Balance Overall balance assessment: Needs assistance         Standing balance support: Bilateral upper extremity supported;Single extremity supported Standing balance-Leahy Scale: Poor Standing balance comment: pt reliant on UE support and external assist from PT to maintain static standing                            Cognition Arousal/Alertness: Awake/alert Behavior During Therapy: WFL for tasks assessed/performed Overall Cognitive Status: Within Functional Limits for tasks assessed                                        Exercises Total Joint Exercises Ankle Circles/Pumps: AROM;Both;10 reps Quad Sets: AROM;Both;10 reps Heel Slides: AAROM;Left;10 reps Hip ABduction/ADduction: AAROM;Left;10 reps Straight Leg Raises: AAROM;Left;10 reps    General Comments        Pertinent Vitals/Pain Pain Assessment: 0-10 Pain Score: 3  Pain Location: L knee Pain Descriptors / Indicators: Sore;Aching Pain Intervention(s): Limited activity within patient's tolerance;Monitored during session    Home Living                      Prior Function            PT Goals (current goals can now be found in the care plan section) Acute Rehab PT Goals Patient Stated Goal: rehab PT Goal Formulation: With patient Time For Goal Achievement: 12/16/16 Potential  to Achieve Goals: Good Progress towards PT goals: Progressing toward goals    Frequency    7X/week      PT Plan Current plan remains appropriate    Co-evaluation              AM-PAC PT "6 Clicks" Daily Activity  Outcome Measure  Difficulty turning over in bed (including adjusting bedclothes, sheets and blankets)?: Unable Difficulty moving from lying on back to sitting on the side of the bed? : Unable Difficulty sitting down on and standing up from a chair with arms (e.g., wheelchair, bedside commode, etc,.)?: Unable Help needed moving to and from a bed to chair (including a wheelchair)?: A Lot Help needed walking in hospital room?: A Lot Help needed  climbing 3-5 steps with a railing? : Total 6 Click Score: 8    End of Session Equipment Utilized During Treatment: Left knee immobilizer;Gait belt Activity Tolerance: Patient limited by fatigue Patient left: in chair;with call bell/phone within reach;with family/visitor present Nurse Communication: Mobility status PT Visit Diagnosis: Difficulty in walking, not elsewhere classified (R26.2);Pain Pain - Right/Left: Left Pain - part of body: Knee     Time: 1102-1117 PT Time Calculation (min) (ACUTE ONLY): 17 min  Charges:  $Gait Training: 8-22 mins $Therapeutic Exercise: 8-22 mins                    G CodesKenyon Salinas, PT Pager: [phone] 12/14/2016    Ryan Salinas 12/14/2016, 1:44 PM

## 2016-12-14 NOTE — Progress Notes (Signed)
   Subjective: 4 Days Post-Op Procedure(s) (LRB): LEFT TOTAL KNEE ARTHROPLASTY (Left) Patient reports pain as moderate.   Plan is to go Skilled nursing facility after hospital stay.  Objective: Vital signs in last 24 hours: Temp:  [99.1 F (37.3 C)-99.4 F (37.4 C)] 99.4 F (37.4 C) (09/23 0605) Pulse Rate:  [90-98] 92 (09/23 0905) Resp:  [16-18] 18 (09/23 0905) BP: (112-169)/(53-86) 112/61 (09/23 0605) SpO2:  [96 %-99 %] 96 % (09/23 0905) FiO2 (%):  [21 %] 21 % (09/23 0905)  Intake/Output from previous day:  Intake/Output Summary (Last 24 hours) at 12/14/16 0937 Last data filed at 12/14/16 3419  Gross per 24 hour  Intake              720 ml  Output              175 ml  Net              545 ml    Intake/Output this shift: No intake/output data recorded.  Labs:  Recent Labs  12/12/16 0518 12/13/16 0543  HGB 9.1* 8.9*    Recent Labs  12/12/16 0518 12/13/16 0543  WBC 11.7* 12.2*  RBC 3.18* 3.04*  HCT 26.5* 25.9*  PLT 179 169    Recent Labs  12/12/16 0518  NA 134*  K 3.4*  CL 99*  CO2 25  BUN 20  CREATININE 1.28*  GLUCOSE 254*  CALCIUM 8.3*   No results for input(s): LABPT, INR in the last 72 hours.  EXAM General - Patient is Alert, Appropriate and Oriented Extremity - Incision: dressing C/D/I No cellulitis present Compartment soft Dressing/Incision - clean, dry, no drainage Motor Function - intact, moving foot and toes well on exam.   Past Medical History:  Diagnosis Date  . Arthritis    RA OF LUNGS ONLY  . Cancer (HCC)    LARYNGEAL CANCER  . COPD (chronic obstructive pulmonary disease) (HCC)    MODERATE-SEVERE - PULMONOGIST IS DR. MARK DONER WITH [facility] IN [place]  . Diabetes mellitus without complication ([place])   . Dysrhythmia    HX OF ATRIAL PREMATURE CONTRACTIONS--DR. WALLMEYER IS PT'S CARDIOLOGIST - [place] [facility] [place]  . Hypercholesterolemia   . Hypertension   . Hypothyroidism   . Multiple  nodules of lung    RHEUMATOID ARTHRITIS OF LUNGS -TAKES DAILY PREDNISONE; POST INFLAMMATORY PULMONARY FIBROSIS, FIBROTIC CHANGES BOTH LUNGS; HAS A COUGH AND PHLEGM  . Pain    IN RIGHT KNEE - HAS MENISCAL TEAR  . Pneumonia    hx of   . Shortness of breath    WITH ANY EXERTION - DUE TO ARTHRITIS OF LUNS  . Tracheostomy in place ([place])    FOR LARYNGEAL CANCER; HX OF RADAITION TX PRIOR TO THE SURGERY    Assessment/Plan: 4 Days Post-Op Procedure(s) (LRB): LEFT TOTAL KNEE ARTHROPLASTY (Left) Active Problems:   History of total knee arthroplasty, left   Up with therapy Plan for discharge tomorrow   Weight-Bearing as tolerated to left leg  Neiko Trivedi V 12/14/2016, 9:37 AM

## 2016-12-14 NOTE — Social Work (Signed)
CSW sent offers to SNF's near patient home.  CSW confirmed same with spouse.  CSW will f/u on bed offers for DC 9/24.  Elissa Hefty, LCSW Weekend Social Worker Clinical Social Worker [phone]

## 2016-12-15 LAB — GLUCOSE, CAPILLARY
Glucose-Capillary: 261 mg/dL — ABNORMAL HIGH (ref 65–99)
Glucose-Capillary: 303 mg/dL — ABNORMAL HIGH (ref 65–99)

## 2016-12-15 MED ORDER — BISACODYL 5 MG PO TBEC: 5.0000 mg | DELAYED_RELEASE_TABLET | Freq: Every day | ORAL | 0 refills | Status: AC | PRN

## 2016-12-15 MED ORDER — DOCUSATE SODIUM 100 MG PO CAPS: 100.0000 mg | ORAL_CAPSULE | Freq: Two times a day (BID) | ORAL | 0 refills | Status: AC

## 2016-12-15 MED ORDER — DOCUSATE SODIUM 100 MG PO CAPS
100.0000 mg | ORAL_CAPSULE | Freq: Two times a day (BID) | ORAL | Status: DC
  Administered 2016-12-15: 14:00:00 100 mg via ORAL
  Filled 2016-12-15: qty 1

## 2016-12-15 MED ORDER — FERROUS SULFATE 325 (65 FE) MG PO TABS: 325.0000 mg | ORAL_TABLET | Freq: Two times a day (BID) | ORAL | 0 refills | Status: AC

## 2016-12-15 MED ORDER — POLYETHYLENE GLYCOL 3350 17 G PO PACK: 17.0000 g | PACK | Freq: Every day | ORAL | 0 refills | Status: AC | PRN

## 2016-12-15 NOTE — Progress Notes (Signed)
Subjective: 5 Days Post-Op Procedure(s) (LRB): LEFT TOTAL KNEE ARTHROPLASTY (Left) Patient reports pain as moderate.   Patient seen in rounds for Dr. Gladstone Lighter. Patient is well, and has had no acute complaints or problems other than pain in the knee that is increased with WB. He denies SOB and chest pain. Voiding well but no BM yet. Reports flatus. No abdominal pain.  Plan is to go Skilled nursing facility after hospital stay.  Objective: Vital signs in last 24 hours: Temp:  [98.1 F (36.7 C)-99.5 F (37.5 C)] 98.1 F (36.7 C) (09/24 0700) Pulse Rate:  [9-95] 82 (09/24 0819) Resp:  [16-20] 16 (09/24 0700) BP: (120-152)/(57-76) 146/69 (09/24 0700) SpO2:  [94 %-98 %] 95 % (09/24 0700) FiO2 (%):  [21 %] 21 % (09/24 0500)  Intake/Output from previous day:  Intake/Output Summary (Last 24 hours) at 12/15/16 0839 Last data filed at 12/15/16 0700  Gross per 24 hour  Intake             1617 ml  Output             1000 ml  Net              617 ml     Labs:  Recent Labs  12/13/16 0543  HGB 8.9*    Recent Labs  12/13/16 0543  WBC 12.2*  RBC 3.04*  HCT 25.9*  PLT 169    EXAM General - Patient is Alert and Oriented Extremity - Neurologically intact Intact pulses distally Dorsiflexion/Plantar flexion intact No cellulitis present Compartment soft Dressing/Incision - clean, dry, no drainage Motor Function - intact, moving foot and toes well on exam.  Bowel sounds active. No abdominal distention   Past Medical History:  Diagnosis Date  . Arthritis    RA OF LUNGS ONLY  . Cancer (HCC)    LARYNGEAL CANCER  . COPD (chronic obstructive pulmonary disease) (HCC)    MODERATE-SEVERE - PULMONOGIST IS DR. MARK DONER WITH [facility] IN [place]  . Diabetes mellitus without complication ([place])   . Dysrhythmia    HX OF ATRIAL PREMATURE CONTRACTIONS--DR. WALLMEYER IS PT'S CARDIOLOGIST - [place] [facility] [place]  . Hypercholesterolemia   .  Hypertension   . Hypothyroidism   . Multiple nodules of lung    RHEUMATOID ARTHRITIS OF LUNGS -TAKES DAILY PREDNISONE; POST INFLAMMATORY PULMONARY FIBROSIS, FIBROTIC CHANGES BOTH LUNGS; HAS A COUGH AND PHLEGM  . Pain    IN RIGHT KNEE - HAS MENISCAL TEAR  . Pneumonia    hx of   . Shortness of breath    WITH ANY EXERTION - DUE TO ARTHRITIS OF LUNS  . Tracheostomy in place ([place])    FOR LARYNGEAL CANCER; HX OF RADAITION TX PRIOR TO THE SURGERY    Assessment/Plan: 5 Days Post-Op Procedure(s) (LRB): LEFT TOTAL KNEE ARTHROPLASTY (Left) Active Problems:   History of total knee arthroplasty, left  Estimated body mass index is 35.99 kg/m as calculated from the following:   Height as of this encounter: 5\' 6"  (1.676 m).   Weight as of this encounter: 101.2 kg (223 lb). Advance diet Up with therapy D/C IV fluids Discharge to SNF  DVT Prophylaxis - Xarelto Weight-Bearing as tolerated    Due to poor progression with therapy, the patient is no longer planning to DC home with outpatient therapy. He will be DC to SNF today. Will transition to aspirin for DVT prophylaxis. Will continue bowel protocol to encourage BM. Will continue iron supplementation. Plan to  follow up in office 2 weeks from surgery day. Discussed with patient that he will be DC home from SNF as soon as he is more self-sufficient, preferably by this Friday.    Ardeen Jourdain, PA-C Orthopaedic Surgery 12/15/2016, 8:39 AM

## 2016-12-15 NOTE — Progress Notes (Signed)
CSW assisting with Discharge needs, CSW provided bed offers. Waiting for return call from SNF [facility].

## 2016-12-15 NOTE — Care Management Important Message (Signed)
Important Message  Patient Details  Name: Ryan Salinas MRN: [id number] Date of Birth: 1934-04-23   Medicare Important Message Given:  Yes    Kerin Salen 12/15/2016, 12:13 [place] Message  Patient Details  Name: Ryan Salinas MRN: [id number] Date of Birth: October 04, 1934   Medicare Important Message Given:  Yes    Kerin Salen 12/15/2016, 12:13 PM

## 2016-12-15 NOTE — Clinical Social Work Placement (Addendum)
D/c Summary faxed.  Nurse given number for report. PTAR called for transport. Spouse completed paperwork at facility.   CLINICAL SOCIAL WORK PLACEMENT  NOTE  Date:  12/15/2016  Patient Details  Name: Ryan Salinas MRN: [id number] Date of Birth: 03-10-1935  Clinical Social Work is seeking post-discharge placement for this patient at the [place] level of care (*CSW will initial, date and re-position this form in  chart as items are completed):  Yes   Patient/family provided with [facility] Work Department's list of facilities offering this level of care within the geographic area requested by the patient (or if unable, by the patient's family).  Yes   Patient/family informed of their freedom to choose among providers that offer the needed level of care, that participate in Medicare, Medicaid or managed care program needed by the patient, have an available bed and are willing to accept the patient.  Yes   Patient/family informed of [place]'s ownership interest in [facility] and [facility], as well as of the fact that they are under no obligation to receive care at these facilities.  PASRR submitted to EDS on       PASRR number received on       Existing PASRR number confirmed on 12/15/16     FL2 transmitted to all facilities in geographic area requested by pt/family on       FL2 transmitted to all facilities within larger geographic area on       Patient informed that his/her managed care company has contracts with or will negotiate with certain facilities, including the following:            Patient/family informed of bed offers received.  Patient [facility] bed at    [facility]    Physician recommends and patient chooses bed at     [facility]  Patient to be transferred to   on  .  Patient to be transferred to facility by   PTAR     Patient family notified on   of transfer. 12/15/2016  Name of family member notified:       Spouse  PHYSICIAN       Additional Comment:    _______________________________________________ Lia Hopping, LCSW 12/15/2016, 1:28 PM

## 2016-12-15 NOTE — Progress Notes (Signed)
Called report to Journalist, newspaper at [place]. No questions at this time.

## 2016-12-15 NOTE — Discharge Summary (Signed)
Physician Discharge Summary   Patient ID: Ryan Salinas MRN: [id number] DOB/AGE: [birth year] 80 y.o.  Admit date: 12/10/2016 Discharge date: 12/15/2016  Primary Diagnosis: Primary osteoarthritis left knee   Admission Diagnoses:  Past Medical History:  Diagnosis Date  . Arthritis    RA OF LUNGS ONLY  . Cancer (HCC)    LARYNGEAL CANCER  . COPD (chronic obstructive pulmonary disease) (HCC)    MODERATE-SEVERE - PULMONOGIST IS DR. MARK DONER WITH [facility] [place]  . Diabetes mellitus without complication ([place])   . Dysrhythmia    HX OF ATRIAL PREMATURE CONTRACTIONS--DR. WALLMEYER IS PT'S CARDIOLOGIST - [facility] [place]  . Hypercholesterolemia   . Hypertension   . Hypothyroidism   . Multiple nodules of lung    RHEUMATOID ARTHRITIS OF LUNGS -TAKES DAILY PREDNISONE; POST INFLAMMATORY PULMONARY FIBROSIS, FIBROTIC CHANGES BOTH LUNGS; HAS A COUGH AND PHLEGM  . Pain    IN RIGHT KNEE - HAS MENISCAL TEAR  . Pneumonia    hx of   . Shortness of breath    WITH ANY EXERTION - DUE TO ARTHRITIS OF LUNS  . Tracheostomy in place [facility])    FOR LARYNGEAL CANCER; HX OF RADAITION TX PRIOR TO THE SURGERY   Discharge Diagnoses:   Active Problems:   History of total knee arthroplasty, left  Estimated body mass index is 35.99 kg/m as calculated from the following:   Height as of this encounter: '5\' 6"'  (1.676 m).   Weight as of this encounter: 101.2 kg (223 lb).  Procedure:  Procedure(s) (LRB): LEFT TOTAL KNEE ARTHROPLASTY (Left)   Consults: None  HPI: Ryan Salinas, 82 y.o. male, has a history of pain and functional disability in the left knee due to arthritis and has failed non-surgical conservative treatments for greater than 12 weeks to includeNSAID's and/or analgesics, corticosteriod injections, use of assistive devices and activity modification.  Onset of symptoms was gradual, starting >10 years ago with gradually worsening course since that time. The  patient noted no past surgery on the left knee(s).  Patient currently rates pain in the left knee(s) at 8 out of 10 with activity. Patient has night pain, worsening of pain with activity and weight bearing, pain that interferes with activities of daily living, pain with passive range of motion, crepitus and joint swelling.  Patient has evidence of periarticular osteophytes and joint space narrowing by imaging studies. There is no active infection.  Laboratory Data: Admission on 12/10/2016  Component Date Value Ref Range Status  . Glucose-Capillary 12/10/2016 139* 65 - 99 mg/dL Final  . Glucose-Capillary 12/10/2016 198* 65 - 99 mg/dL Final  . Comment 1 12/10/2016 Notify RN   Final  . Comment 2 12/10/2016 Document in Chart   Final  . Glucose-Capillary 12/10/2016 279* 65 - 99 mg/dL Final  . WBC 12/11/2016 13.3* 4.0 - 10.5 K/uL Final  . RBC 12/11/2016 3.42* 4.22 - 5.81 MIL/uL Final  . Hemoglobin 12/11/2016 10.0* 13.0 - 17.0 g/dL Final  . HCT 12/11/2016 28.8* 39.0 - 52.0 % Final  . MCV 12/11/2016 84.2  78.0 - 100.0 fL Final  . MCH 12/11/2016 29.2  26.0 - 34.0 pg Final  . MCHC 12/11/2016 34.7  30.0 - 36.0 g/dL Final  . RDW 12/11/2016 14.8  11.5 - 15.5 % Final  . Platelets 12/11/2016 191  150 - 400 K/uL Final  . Sodium 12/11/2016 135  135 - 145 mmol/L Final  . Potassium 12/11/2016 3.9  3.5 - 5.1 mmol/L Final  . Chloride  12/11/2016 101  101 - 111 mmol/L Final  . CO2 12/11/2016 25  22 - 32 mmol/L Final  . Glucose, Bld 12/11/2016 313* 65 - 99 mg/dL Final  . BUN 12/11/2016 23* 6 - 20 mg/dL Final  . Creatinine, Ser 12/11/2016 1.31* 0.61 - 1.24 mg/dL Final  . Calcium 12/11/2016 8.5* 8.9 - 10.3 mg/dL Final  . GFR calc non Af Amer 12/11/2016 50* >60 mL/min Final  . GFR calc Af Amer 12/11/2016 58* >60 mL/min Final   Comment: (NOTE) The eGFR has been calculated using the CKD EPI equation. This calculation has not been validated in all clinical situations. eGFR's persistently <60 mL/min signify  possible Chronic Kidney Disease.   . Anion gap 12/11/2016 9  5 - 15 Final  . Glucose-Capillary 12/10/2016 313* 65 - 99 mg/dL Final  . Comment 1 12/10/2016 Notify RN   Final  . Comment 2 12/10/2016 Document in Chart   Final  . Glucose-Capillary 12/11/2016 254* 65 - 99 mg/dL Final  . Glucose-Capillary 12/11/2016 252* 65 - 99 mg/dL Final  . WBC 12/12/2016 11.7* 4.0 - 10.5 K/uL Final  . RBC 12/12/2016 3.18* 4.22 - 5.81 MIL/uL Final  . Hemoglobin 12/12/2016 9.1* 13.0 - 17.0 g/dL Final  . HCT 12/12/2016 26.5* 39.0 - 52.0 % Final  . MCV 12/12/2016 83.3  78.0 - 100.0 fL Final  . MCH 12/12/2016 28.6  26.0 - 34.0 pg Final  . MCHC 12/12/2016 34.3  30.0 - 36.0 g/dL Final  . RDW 12/12/2016 14.9  11.5 - 15.5 % Final  . Platelets 12/12/2016 179  150 - 400 K/uL Final  . Sodium 12/12/2016 134* 135 - 145 mmol/L Final  . Potassium 12/12/2016 3.4* 3.5 - 5.1 mmol/L Final  . Chloride 12/12/2016 99* 101 - 111 mmol/L Final  . CO2 12/12/2016 25  22 - 32 mmol/L Final  . Glucose, Bld 12/12/2016 254* 65 - 99 mg/dL Final  . BUN 12/12/2016 20  6 - 20 mg/dL Final  . Creatinine, Ser 12/12/2016 1.28* 0.61 - 1.24 mg/dL Final  . Calcium 12/12/2016 8.3* 8.9 - 10.3 mg/dL Final  . GFR calc non Af Amer 12/12/2016 51* >60 mL/min Final  . GFR calc Af Amer 12/12/2016 59* >60 mL/min Final   Comment: (NOTE) The eGFR has been calculated using the CKD EPI equation. This calculation has not been validated in all clinical situations. eGFR's persistently <60 mL/min signify possible Chronic Kidney Disease.   . Anion gap 12/12/2016 10  5 - 15 Final  . Glucose-Capillary 12/11/2016 215* 65 - 99 mg/dL Final  . Glucose-Capillary 12/11/2016 241* 65 - 99 mg/dL Final  . Glucose-Capillary 12/12/2016 235* 65 - 99 mg/dL Final  . Glucose-Capillary 12/12/2016 224* 65 - 99 mg/dL Final  . WBC 12/13/2016 12.2* 4.0 - 10.5 K/uL Final  . RBC 12/13/2016 3.04* 4.22 - 5.81 MIL/uL Final  . Hemoglobin 12/13/2016 8.9* 13.0 - 17.0 g/dL Final  . HCT  12/13/2016 25.9* 39.0 - 52.0 % Final  . MCV 12/13/2016 85.2  78.0 - 100.0 fL Final  . MCH 12/13/2016 29.3  26.0 - 34.0 pg Final  . MCHC 12/13/2016 34.4  30.0 - 36.0 g/dL Final  . RDW 12/13/2016 15.1  11.5 - 15.5 % Final  . Platelets 12/13/2016 169  150 - 400 K/uL Final  . Glucose-Capillary 12/12/2016 254* 65 - 99 mg/dL Final  . Glucose-Capillary 12/12/2016 238* 65 - 99 mg/dL Final  . Glucose-Capillary 12/13/2016 218* 65 - 99 mg/dL Final  . Glucose-Capillary 12/13/2016 320* 65 -  99 mg/dL Final  . Glucose-Capillary 12/13/2016 241* 65 - 99 mg/dL Final  . Glucose-Capillary 12/13/2016 270* 65 - 99 mg/dL Final  . Glucose-Capillary 12/14/2016 266* 65 - 99 mg/dL Final  . Glucose-Capillary 12/14/2016 283* 65 - 99 mg/dL Final  . Glucose-Capillary 12/14/2016 309* 65 - 99 mg/dL Final  . Glucose-Capillary 12/14/2016 293* 65 - 99 mg/dL Final  . Glucose-Capillary 12/15/2016 261* 65 - 99 mg/dL Final  Hospital Outpatient Visit on 12/04/2016  Component Date Value Ref Range Status  . aPTT 12/04/2016 32  24 - 36 seconds Final  . WBC 12/04/2016 7.3  4.0 - 10.5 K/uL Final  . RBC 12/04/2016 3.70* 4.22 - 5.81 MIL/uL Final  . Hemoglobin 12/04/2016 10.8* 13.0 - 17.0 g/dL Final  . HCT 12/04/2016 31.8* 39.0 - 52.0 % Final  . MCV 12/04/2016 85.9  78.0 - 100.0 fL Final  . MCH 12/04/2016 29.2  26.0 - 34.0 pg Final  . MCHC 12/04/2016 34.0  30.0 - 36.0 g/dL Final  . RDW 12/04/2016 15.1  11.5 - 15.5 % Final  . Platelets 12/04/2016 203  150 - 400 K/uL Final  . Neutrophils Relative % 12/04/2016 72  % Final  . Lymphocytes Relative 12/04/2016 21  % Final  . Monocytes Relative 12/04/2016 5  % Final  . Eosinophils Relative 12/04/2016 2  % Final  . Basophils Relative 12/04/2016 0  % Final  . Neutro Abs 12/04/2016 5.3  1.7 - 7.7 K/uL Final  . Lymphs Abs 12/04/2016 1.5  0.7 - 4.0 K/uL Final  . Monocytes Absolute 12/04/2016 0.4  0.1 - 1.0 K/uL Final  . Eosinophils Absolute 12/04/2016 0.1  0.0 - 0.7 K/uL Final  . Basophils  Absolute 12/04/2016 0.0  0.0 - 0.1 K/uL Final  . RBC Morphology 12/04/2016 ELLIPTOCYTES   Final  . WBC Morphology 12/04/2016 MILD LEFT SHIFT (1-5% METAS, OCC MYELO, OCC BANDS)   Final  . Sodium 12/04/2016 140  135 - 145 mmol/L Final  . Potassium 12/04/2016 3.5  3.5 - 5.1 mmol/L Final  . Chloride 12/04/2016 106  101 - 111 mmol/L Final  . CO2 12/04/2016 25  22 - 32 mmol/L Final  . Glucose, Bld 12/04/2016 116* 65 - 99 mg/dL Final  . BUN 12/04/2016 23* 6 - 20 mg/dL Final  . Creatinine, Ser 12/04/2016 1.22  0.61 - 1.24 mg/dL Final  . Calcium 12/04/2016 8.9  8.9 - 10.3 mg/dL Final  . Total Protein 12/04/2016 6.9  6.5 - 8.1 g/dL Final  . Albumin 12/04/2016 4.0  3.5 - 5.0 g/dL Final  . AST 12/04/2016 16  15 - 41 U/L Final  . ALT 12/04/2016 17  17 - 63 U/L Final  . Alkaline Phosphatase 12/04/2016 74  38 - 126 U/L Final  . Total Bilirubin 12/04/2016 0.5  0.3 - 1.2 mg/dL Final  . GFR calc non Af Amer 12/04/2016 54* >60 mL/min Final  . GFR calc Af Amer 12/04/2016 >60  >60 mL/min Final   Comment: (NOTE) The eGFR has been calculated using the CKD EPI equation. This calculation has not been validated in all clinical situations. eGFR's persistently <60 mL/min signify possible Chronic Kidney Disease.   . Anion gap 12/04/2016 9  5 - 15 Final  . Prothrombin Time 12/04/2016 13.2  11.4 - 15.2 seconds Final  . INR 12/04/2016 1.01   Final  . ABO/RH(D) 12/04/2016 A POS   Final  . Antibody Screen 12/04/2016 NEG   Final  . Sample Expiration 12/04/2016 12/13/2016   Final  .  Extend sample reason 12/04/2016 NO TRANSFUSIONS OR PREGNANCY IN THE PAST 3 MONTHS   Final  . Color, Urine 12/04/2016 YELLOW  YELLOW Final  . APPearance 12/04/2016 CLEAR  CLEAR Final  . Specific Gravity, Urine 12/04/2016 1.016  1.005 - 1.030 Final  . pH 12/04/2016 5.0  5.0 - 8.0 Final  . Glucose, UA 12/04/2016 NEGATIVE  NEGATIVE mg/dL Final  . Hgb urine dipstick 12/04/2016 SMALL* NEGATIVE Final  . Bilirubin Urine 12/04/2016 NEGATIVE   NEGATIVE Final  . Ketones, ur 12/04/2016 NEGATIVE  NEGATIVE mg/dL Final  . Protein, ur 12/04/2016 NEGATIVE  NEGATIVE mg/dL Final  . Nitrite 12/04/2016 NEGATIVE  NEGATIVE Final  . Leukocytes, UA 12/04/2016 NEGATIVE  NEGATIVE Final  . RBC / HPF 12/04/2016 0-5  0 - 5 RBC/hpf Final  . WBC, UA 12/04/2016 0-5  0 - 5 WBC/hpf Final  . Bacteria, UA 12/04/2016 RARE* NONE SEEN Final  . Squamous Epithelial / LPF 12/04/2016 0-5* NONE SEEN Final  . Mucus 12/04/2016 PRESENT   Final  . Hgb A1c MFr Bld 12/04/2016 7.4* 4.8 - 5.6 % Final   Comment: (NOTE) Pre diabetes:          5.7%-6.4% Diabetes:              >6.4% Glycemic control for   <7.0% adults with diabetes   . Mean Plasma Glucose 12/04/2016 165.68  mg/dL Final   Performed at [facility] [address], [place], [place] [address]  . MRSA, PCR 12/04/2016 NEGATIVE  NEGATIVE Final  . Staphylococcus aureus 12/04/2016 POSITIVE* NEGATIVE Final   Comment: (NOTE) The Xpert SA Assay (FDA approved for NASAL specimens in patients 41 years of age and older), is one component of a comprehensive surveillance program. It is not intended to diagnose infection nor to guide or monitor treatment.   . Glucose-Capillary 12/04/2016 108* 65 - 99 mg/dL Final     X-Rays:Dg Chest 2 View  Result Date: 12/13/2016 CLINICAL DATA:  Fever. EXAM: CHEST  2 VIEW COMPARISON:  July 13, 2014 FINDINGS: Cardiomegaly. The hila and mediastinum are unchanged. No pulmonary nodules or masses. No focal infiltrates. Minimal atelectasis in the bases. No acute abnormalities. IMPRESSION: No cause for fever identified. Electronically Signed   By: Dorise Bullion III M.D   On: 12/13/2016 09:49    EKG: Orders placed or performed during the hospital encounter of 12/04/16  . EKG  . EKG     Hospital Course: Ryan Salinas is a 82 y.o. who was admitted to [facility]. They were brought to the operating room on 12/10/2016 and underwent Procedure(s): LEFT TOTAL KNEE  ARTHROPLASTY.  Patient tolerated the procedure well and was later transferred to the recovery room and then to the orthopaedic floor for postoperative care.  They were given PO and IV analgesics for pain control following their surgery.  They were given 24 hours of postoperative antibiotics of  Anti-infectives    Start     Dose/Rate Route Frequency Ordered Stop   12/10/16 1800  ceFAZolin (ANCEF) IVPB 1 g/50 mL premix     1 g 100 mL/hr over 30 Minutes Intravenous Every 6 hours 12/10/16 1631 12/11/16 0121   12/10/16 1209  polymyxin B 500,000 Units, bacitracin 50,000 Units in sodium chloride 0.9 % 500 mL irrigation  Status:  Discontinued       As needed 12/10/16 1209 12/10/16 1351   12/10/16 0905  ceFAZolin (ANCEF) IVPB 2g/100 mL premix     2 g 200 mL/hr over 30  Minutes Intravenous On call to O.R. 12/10/16 [id number] 12/10/16 1213     and started on DVT prophylaxis in the form of Xarelto.   PT and OT were ordered for total joint protocol.  Discharge planning consulted to help with postop disposition and equipment needs.  Patient had a fair night on the evening of surgery.  They started to get up OOB with therapy on day one. Hemovac drain was pulled without difficulty.  Continued to work with therapy into day two.  He had some issues with poor output which did improve with time. Dressing was changed on day two and the incision was clean and dry. He was progressing slowly with therapy. By day three and four, the patient had not progressed with therapy and was not meeting their goals.  Incision was healing well. Decision was made to DC the patient to SNF for more assistance.    Diet: Cardiac diet and Diabetic diet Activity:WBAT Follow-up: 2 weeks from surgery Disposition - Skilled nursing facility Discharged Condition: stable   Discharge Instructions    Call MD / Call 911    Complete by:  As directed    If you experience chest pain or shortness of breath, CALL 911 and be transported to the hospital  emergency room.  If you develope a fever above 101 F, pus (white drainage) or increased drainage or redness at the wound, or calf pain, call your surgeon's office.   Constipation Prevention    Complete by:  As directed    Drink plenty of fluids.  Prune juice may be helpful.  You may use a stool softener, such as Colace (over the counter) 100 mg twice a day.  Use MiraLax (over the counter) for constipation as needed.   Diet - low sodium heart healthy    Complete by:  As directed    Diet Carb Modified    Complete by:  As directed    Discharge instructions    Complete by:  As directed    INSTRUCTIONS AFTER JOINT REPLACEMENT   Remove items at home which could result in a fall. This includes throw rugs or furniture in walking pathways ICE to the affected joint every three hours while awake for 30 minutes at a time, for at least the first 3-5 days, and then as needed for pain and swelling.  Continue to use ice for pain and swelling. You may notice swelling that will progress down to the foot and ankle.  This is normal after surgery.  Elevate your leg when you are not up walking on it.   Continue to use the breathing machine you got in the hospital (incentive spirometer) which will help keep your temperature down.  It is common for your temperature to cycle up and down following surgery, especially at night when you are not up moving around and exerting yourself.  The breathing machine keeps your lungs expanded and your temperature down.   DIET:  As you were doing prior to hospitalization, we recommend a well-balanced diet.  DRESSING / WOUND CARE / SHOWERING  You may change your dressing every day with sterile gauze.  Please use good hand washing techniques before changing the dressing.  Do not use any lotions or creams on the incision until instructed by your surgeon.  ACTIVITY  Increase activity slowly as tolerated, but follow the weight bearing instructions below.   No driving for 6 weeks or  until further direction given by your physician.  You cannot drive while taking narcotics.  No lifting or carrying greater than 10 lbs. until further directed by your surgeon. Avoid periods of inactivity such as sitting longer than an hour when not asleep. This helps prevent blood clots.  You may return to work once you are authorized by your doctor.     WEIGHT BEARING   Weight bearing as tolerated with assist device (walker, cane, etc) as directed, use it as long as suggested by your surgeon or therapist, typically at least 4-6 weeks.   EXERCISES  Results after joint replacement surgery are often greatly improved when you follow the exercise, range of motion and muscle strengthening exercises prescribed by your doctor. Safety measures are also important to protect the joint from further injury. Any time any of these exercises cause you to have increased pain or swelling, decrease what you are doing until you are comfortable again and then slowly increase them. If you have problems or questions, call your caregiver or physical therapist for advice.   Rehabilitation is important following a joint replacement. After just a few days of immobilization, the muscles of the leg can become weakened and shrink (atrophy).  These exercises are designed to build up the tone and strength of the thigh and leg muscles and to improve motion. Often times heat used for twenty to thirty minutes before working out will loosen up your tissues and help with improving the range of motion but do not use heat for the first two weeks following surgery (sometimes heat can increase post-operative swelling).   These exercises can be done on a training (exercise) mat, on the floor, on a table or on a bed. Use whatever works the best and is most comfortable for you.    Use music or television while you are exercising so that the exercises are a pleasant break in your day. This will make your life better with the exercises acting  as a break in your routine that you can look forward to.   Perform all exercises about fifteen times, three times per day or as directed.  You should exercise both the operative leg and the other leg as well.  Exercises include:   Quad Sets - Tighten up the muscle on the front of the thigh (Quad) and hold for 5-10 seconds.   Straight Leg Raises - With your knee straight (if you were given a brace, keep it on), lift the leg to 60 degrees, hold for 3 seconds, and slowly lower the leg.  Perform this exercise against resistance later as your leg gets stronger.  Leg Slides: Lying on your back, slowly slide your foot toward your buttocks, bending your knee up off the floor (only go as far as is comfortable). Then slowly slide your foot back down until your leg is flat on the floor again.  Angel Wings: Lying on your back spread your legs to the side as far apart as you can without causing discomfort.  Hamstring Strength:  Lying on your back, push your heel against the floor with your leg straight by tightening up the muscles of your buttocks.  Repeat, but this time bend your knee to a comfortable angle, and push your heel against the floor.  You may put a pillow under the heel to make it more comfortable if necessary.   A rehabilitation program following joint replacement surgery can speed recovery and prevent re-injury in the future due to weakened muscles. Contact your doctor or a physical therapist for more information on knee rehabilitation.  CONSTIPATION  Constipation is defined medically as fewer than three stools per week and severe constipation as less than one stool per week.  Even if you have a regular bowel pattern at home, your normal regimen is likely to be disrupted due to multiple reasons following surgery.  Combination of anesthesia, postoperative narcotics, change in appetite and fluid intake all can affect your bowels.   YOU MUST use at least one of the following options; they are  listed in order of increasing strength to get the job done.  They are all available over the counter, and you may need to use some, POSSIBLY even all of these options:    Drink plenty of fluids (prune juice may be helpful) and high fiber foods Colace 100 mg by mouth twice a day  Senokot for constipation as directed and as needed Dulcolax (bisacodyl), take with full glass of water  Miralax (polyethylene glycol) once or twice a day as needed.  If you have tried all these things and are unable to have a bowel movement in the first 3-4 days after surgery call either your surgeon or your primary doctor.    If you experience loose stools or diarrhea, hold the medications until you stool forms back up.  If your symptoms do not get better within 1 week or if they get worse, check with your doctor.  If you experience "the worst abdominal pain ever" or develop nausea or vomiting, please contact the office immediately for further recommendations for treatment.   ITCHING:  If you experience itching with your medications, try taking only a single pain pill, or even half a pain pill at a time.  You can also use Benadryl over the counter for itching or also to help with sleep.   TED HOSE STOCKINGS:  Use stockings on both legs until for at least 2 weeks or as directed by physician office. They may be removed at night for sleeping.  MEDICATIONS:  See your medication summary on the "After Visit Summary" that nursing will review with you.  You may have some home medications which will be placed on hold until you complete the course of blood thinner medication.  It is important for you to complete the blood thinner medication as prescribed.  PRECAUTIONS:  If you experience chest pain or shortness of breath - call 911 immediately for transfer to the hospital emergency department.   If you develop a fever greater that 101 F, purulent drainage from wound, increased redness or drainage from wound, foul odor from the  wound/dressing, or calf pain - CONTACT YOUR SURGEON.                                                   FOLLOW-UP APPOINTMENTS:  If you do not already have a post-op appointment, please call the office for an appointment to be seen by your surgeon.  Guidelines for how soon to be seen are listed in your "After Visit Summary", but are typically between 1-4 weeks after surgery.   MAKE SURE YOU:  Understand these instructions.  Get help right away if you are not doing well or get worse.    Thank you for letting us be a part of your medical care team.  It is a privilege we respect greatly.  We hope these instructions will help you stay on track  for a fast and full recovery!   Increase activity slowly as tolerated    Complete by:  As directed      Allergies as of 12/15/2016      Reactions   Ciprofloxacin Anaphylaxis   Morphine And Related Nausea And Vomiting   Buprenorphine Hcl Nausea And Vomiting      Medication List    STOP taking these medications   indomethacin 25 MG capsule Commonly known as:  INDOCIN     TAKE these medications   albuterol (2.5 MG/3ML) 0.083% nebulizer solution Commonly known as:  PROVENTIL Take 2.5 mg by nebulization every 6 (six) hours as needed for wheezing or shortness of breath.   amLODipine 10 MG tablet Commonly known as:  NORVASC Take 10 mg by mouth daily.   ARTIFICIAL TEAR OP Place 1 drop into both eyes every 8 (eight) hours as needed (dry eyes).   aspirin EC 325 MG tablet Take 1 tablet (325 mg total) by mouth 2 (two) times daily. What changed:  medication strength  how much to take  when to take this   benazepril 40 MG tablet Commonly known as:  LOTENSIN Take 40 mg by mouth 2 (two) times daily.   bisacodyl 5 MG EC tablet Commonly known as:  DULCOLAX Take 1 tablet (5 mg total) by mouth daily as needed for moderate constipation.   cloNIDine 0.1 MG tablet Commonly known as:  CATAPRES Take 0.1 mg by mouth at bedtime.   docusate sodium  100 MG capsule Commonly known as:  COLACE Take 1 capsule (100 mg total) by mouth 2 (two) times daily.   ferrous sulfate 325 (65 FE) MG tablet Take 1 tablet (325 mg total) by mouth 2 (two) times daily with a meal.   gabapentin 300 MG capsule Commonly known as:  NEURONTIN Take 300 mg by mouth at bedtime as needed (pain).   glipiZIDE 10 MG 24 hr tablet Commonly known as:  GLUCOTROL XL Take 10 mg by mouth daily with supper.   HYDROcodone-acetaminophen 5-325 MG tablet Commonly known as:  NORCO/VICODIN Take 1-2 tablets by mouth every 4 (four) hours as needed (breakthrough pain).   JANUVIA 100 MG tablet Generic drug:  sitaGLIPtin Take 100 mg by mouth daily.   labetalol 300 MG tablet Commonly known as:  NORMODYNE Take 300 mg by mouth 2 (two) times daily.   levothyroxine 100 MCG tablet Commonly known as:  SYNTHROID, LEVOTHROID Take 100 mcg by mouth daily before breakfast.   metFORMIN 500 MG 24 hr tablet Commonly known as:  GLUCOPHAGE-XR Take 1,000 mg by mouth 2 (two) times daily with a meal.   methocarbamol 500 MG tablet Commonly known as:  ROBAXIN Take 1 tablet (500 mg total) by mouth every 6 (six) hours as needed for muscle spasms.   omeprazole 40 MG capsule Commonly known as:  PRILOSEC Take 40 mg by mouth at bedtime.   polyethylene glycol packet Commonly known as:  MIRALAX / GLYCOLAX Take 17 g by mouth daily as needed for mild constipation.   pravastatin 20 MG tablet Commonly known as:  PRAVACHOL Take 20 mg by mouth at bedtime.   predniSONE 2.5 MG tablet Commonly known as:  DELTASONE Take 2.5 mg by mouth 2 (two) times daily with a meal. 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  Discharge Care Instructions        Start     Ordered   12/15/16 0000  polyethylene glycol (MIRALAX / GLYCOLAX) packet  Daily PRN       12/15/16 0833   12/15/16 0000  ferrous sulfate 325 (65 FE) MG tablet  2 times daily with meals     12/15/16 0833   12/15/16 0000  docusate sodium (COLACE) 100 MG capsule  2 times daily     12/15/16 0838   12/15/16 0000  bisacodyl (DULCOLAX) 5 MG EC tablet  Daily PRN     12/15/16 0838   12/11/16 0000  HYDROcodone-acetaminophen (NORCO/VICODIN) 5-325 MG tablet  Every 4 hours PRN     12/11/16 0718   12/11/16 0000  methocarbamol (ROBAXIN) 500 MG tablet  Every 6 hours PRN     12/11/16 0718   12/11/16 0000  aspirin EC 325 MG tablet  2 times daily     12/11/16 0718   12/11/16 0000  Call MD / Call 911    Comments:  If you experience chest pain or shortness of breath, CALL 911 and be transported to the hospital emergency room.  If you develope a fever above 101 F, pus (white drainage) or increased drainage or redness at the wound, or calf pain, call your surgeon's office.   12/11/16 0718   12/11/16 0000  Diet - low sodium heart healthy     12/11/16 0718   12/11/16 0000  Constipation Prevention    Comments:  Drink plenty of fluids.  Prune juice may be helpful.  You may use a stool softener, such as Colace (over the counter) 100 mg twice a day.  Use MiraLax (over the counter) for constipation as needed.   12/11/16 0718   12/11/16 0000  Increase activity slowly as tolerated     12/11/16 0718   12/11/16 0000  Diet Carb Modified     12/11/16 0718   12/11/16 0000  Discharge instructions    Comments:  INSTRUCTIONS AFTER JOINT REPLACEMENT   Remove items at home which could result in a fall. This includes throw rugs or furniture in walking pathways ICE to the affected joint every three hours while awake for 30 minutes at a time, for at least the first 3-5 days, and then as needed for pain and swelling.  Continue to use ice for pain and swelling. You may notice swelling that will progress down to the foot and ankle.  This is normal after surgery.  Elevate your leg when you are not up walking on it.    Continue to use the breathing machine you got in the hospital (incentive spirometer) which will help keep your temperature down.  It is common for your temperature to cycle up and down following surgery, especially at night when you are not up moving around and exerting yourself.  The breathing machine keeps your lungs expanded and your temperature down.   DIET:  As you were doing prior to hospitalization, we recommend a well-balanced diet.  DRESSING / WOUND CARE / SHOWERING  You may change your dressing every day with sterile gauze.  Please use good hand washing techniques before changing the dressing.  Do not use any lotions or creams on the incision until instructed by your surgeon.  ACTIVITY  Increase activity slowly as tolerated, but follow the weight bearing instructions below.   No driving for 6 weeks or until further direction given by your physician.  You cannot drive while taking narcotics.  No lifting or carrying greater than 10 lbs. until further directed by your surgeon. Avoid periods of inactivity such as sitting longer than an hour when not asleep. This helps prevent blood clots.  You may return to work once you are authorized by your doctor.     WEIGHT BEARING   Weight bearing as tolerated with assist device (walker, cane, etc) as directed, use it as long as suggested by your surgeon or therapist, typically at least 4-6 weeks.   EXERCISES  Results after joint replacement surgery are often greatly improved when you follow the exercise, range of motion and muscle strengthening exercises prescribed by your doctor. Safety measures are also important to protect the joint from further injury. Any time any of these exercises cause you to have increased pain or swelling, decrease what you are doing until you are comfortable again and then slowly increase them. If you have problems or questions, call your caregiver or physical therapist for advice.   Rehabilitation is important  following a joint replacement. After just a few days of immobilization, the muscles of the leg can become weakened and shrink (atrophy).  These exercises are designed to build up the tone and strength of the thigh and leg muscles and to improve motion. Often times heat used for twenty to thirty minutes before working out will loosen up your tissues and help with improving the range of motion but do not use heat for the first two weeks following surgery (sometimes heat can increase post-operative swelling).   These exercises can be done on a training (exercise) mat, on the floor, on a table or on a bed. Use whatever works the best and is most comfortable for you.    Use music or television while you are exercising so that the exercises are a pleasant break in your day. This will make your life better with the exercises acting as a break in your routine that you can look forward to.   Perform all exercises about fifteen times, three times per day or as directed.  You should exercise both the operative leg and the other leg as well.  Exercises include:   Quad Sets - Tighten up the muscle on the front of the thigh (Quad) and hold for 5-10 seconds.   Straight Leg Raises - With your knee straight (if you were given a brace, keep it on), lift the leg to 60 degrees, hold for 3 seconds, and slowly lower the leg.  Perform this exercise against resistance later as your leg gets stronger.  Leg Slides: Lying on your back, slowly slide your foot toward your buttocks, bending your knee up off the floor (only go as far as is comfortable). Then slowly slide your foot back down until your leg is flat on the floor again.  Angel Wings: Lying on your back spread your legs to the side as far apart as you can without causing discomfort.  Hamstring Strength:  Lying on your back, push your heel against the floor with your leg straight by tightening up the muscles of your buttocks.  Repeat, but this time bend your knee to a  comfortable angle, and push your heel against the floor.  You may put a pillow under the heel to make it more comfortable if necessary.   A rehabilitation program following joint replacement surgery can speed recovery and prevent re-injury in the future due to weakened muscles. Contact your doctor or a physical therapist for more information on knee rehabilitation.  CONSTIPATION  Constipation is defined medically as fewer than three stools per week and severe constipation as less than one stool per week.  Even if you have a regular bowel pattern at home, your normal regimen is likely to be disrupted due to multiple reasons following surgery.  Combination of anesthesia, postoperative narcotics, change in appetite and fluid intake all can affect your bowels.   YOU MUST use at least one of the following options; they are listed in order of increasing strength to get the job done.  They are all available over the counter, and you may need to use some, POSSIBLY even all of these options:    Drink plenty of fluids (prune juice may be helpful) and high fiber foods Colace 100 mg by mouth twice a day  Senokot for constipation as directed and as needed Dulcolax (bisacodyl), take with full glass of water  Miralax (polyethylene glycol) once or twice a day as needed.  If you have tried all these things and are unable to have a bowel movement in the first 3-4 days after surgery call either your surgeon or your primary doctor.    If you experience loose stools or diarrhea, hold the medications until you stool forms back up.  If your symptoms do not get better within 1 week or if they get worse, check with your doctor.  If you experience "the worst abdominal pain ever" or develop nausea or vomiting, please contact the office immediately for further recommendations for treatment.   ITCHING:  If you experience itching with your medications, try taking only a single pain pill, or even half a pain pill at a time.   You can also use Benadryl over the counter for itching or also to help with sleep.   TED HOSE STOCKINGS:  Use stockings on both legs until for at least 2 weeks or as directed by physician office. They may be removed at night for sleeping.  MEDICATIONS:  See your medication summary on the "After Visit Summary" that nursing will review with you.  You may have some home medications which will be placed on hold until you complete the course of blood thinner medication.  It is important for you to complete the blood thinner medication as prescribed.  PRECAUTIONS:  If you experience chest pain or shortness of breath - call 911 immediately for transfer to the hospital emergency department.   If you develop a fever greater that 101 F, purulent drainage from wound, increased redness or drainage from wound, foul odor from the wound/dressing, or calf pain - CONTACT YOUR SURGEON.                                                   FOLLOW-UP APPOINTMENTS:  If you do not already have a post-op appointment, please call the office for an appointment to be seen by your surgeon.  Guidelines for how soon to be seen are listed in your "After Visit Summary", but are typically between 1-4 weeks after surgery.   MAKE SURE YOU:  Understand these instructions.  Get help right away if you are not doing well or get worse.    Thank you for letting us be a part of your medical care team.  It is a privilege we respect greatly.  We hope these instructions will help you stay on track for  a fast and full recovery!   12/11/16 8016     Follow-up Information    Latanya Maudlin, MD. Schedule an appointment as soon as possible for a visit in 2 week(s).   Specialty:  Orthopedic Surgery Contact information: [address] [place] [address] [phone]         Plan for [facility] to [facility] Monday 12/15/2016 with plan for DC home as soon as patient is more self-sufficient. Plan for DC home by Friday  12/19/2016.  Signed: Ardeen Jourdain, PA-C Orthopaedic Surgery 12/15/2016, 8:43 AM

## 2019-06-06 IMAGING — DX DG CHEST 2V
2 series · 2 of 2 positions shown · non-contrast
Comparison: July 13, 2014

CLINICAL DATA: Fever.

EXAM:
CHEST  2 VIEW

[chest lat]
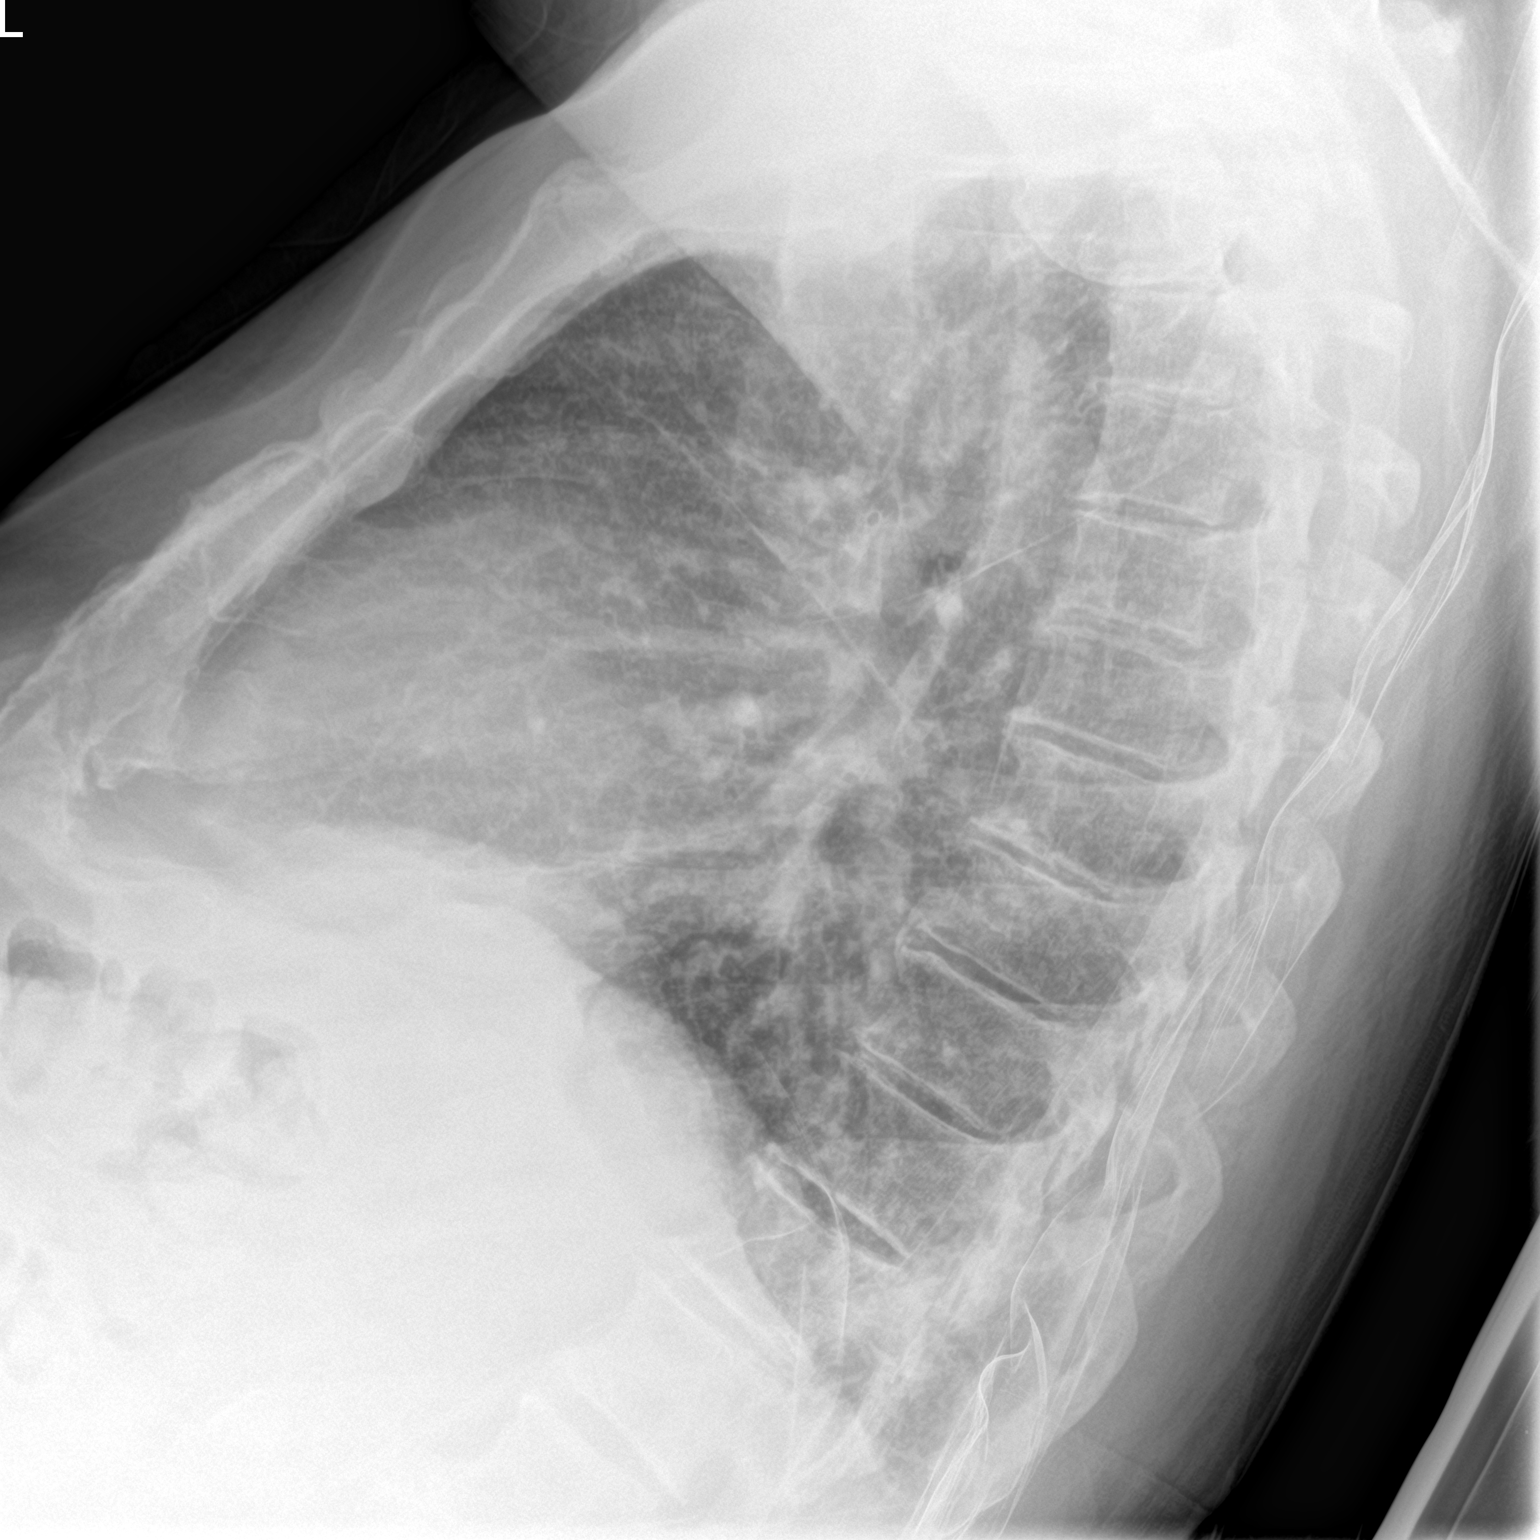

[chest ap]
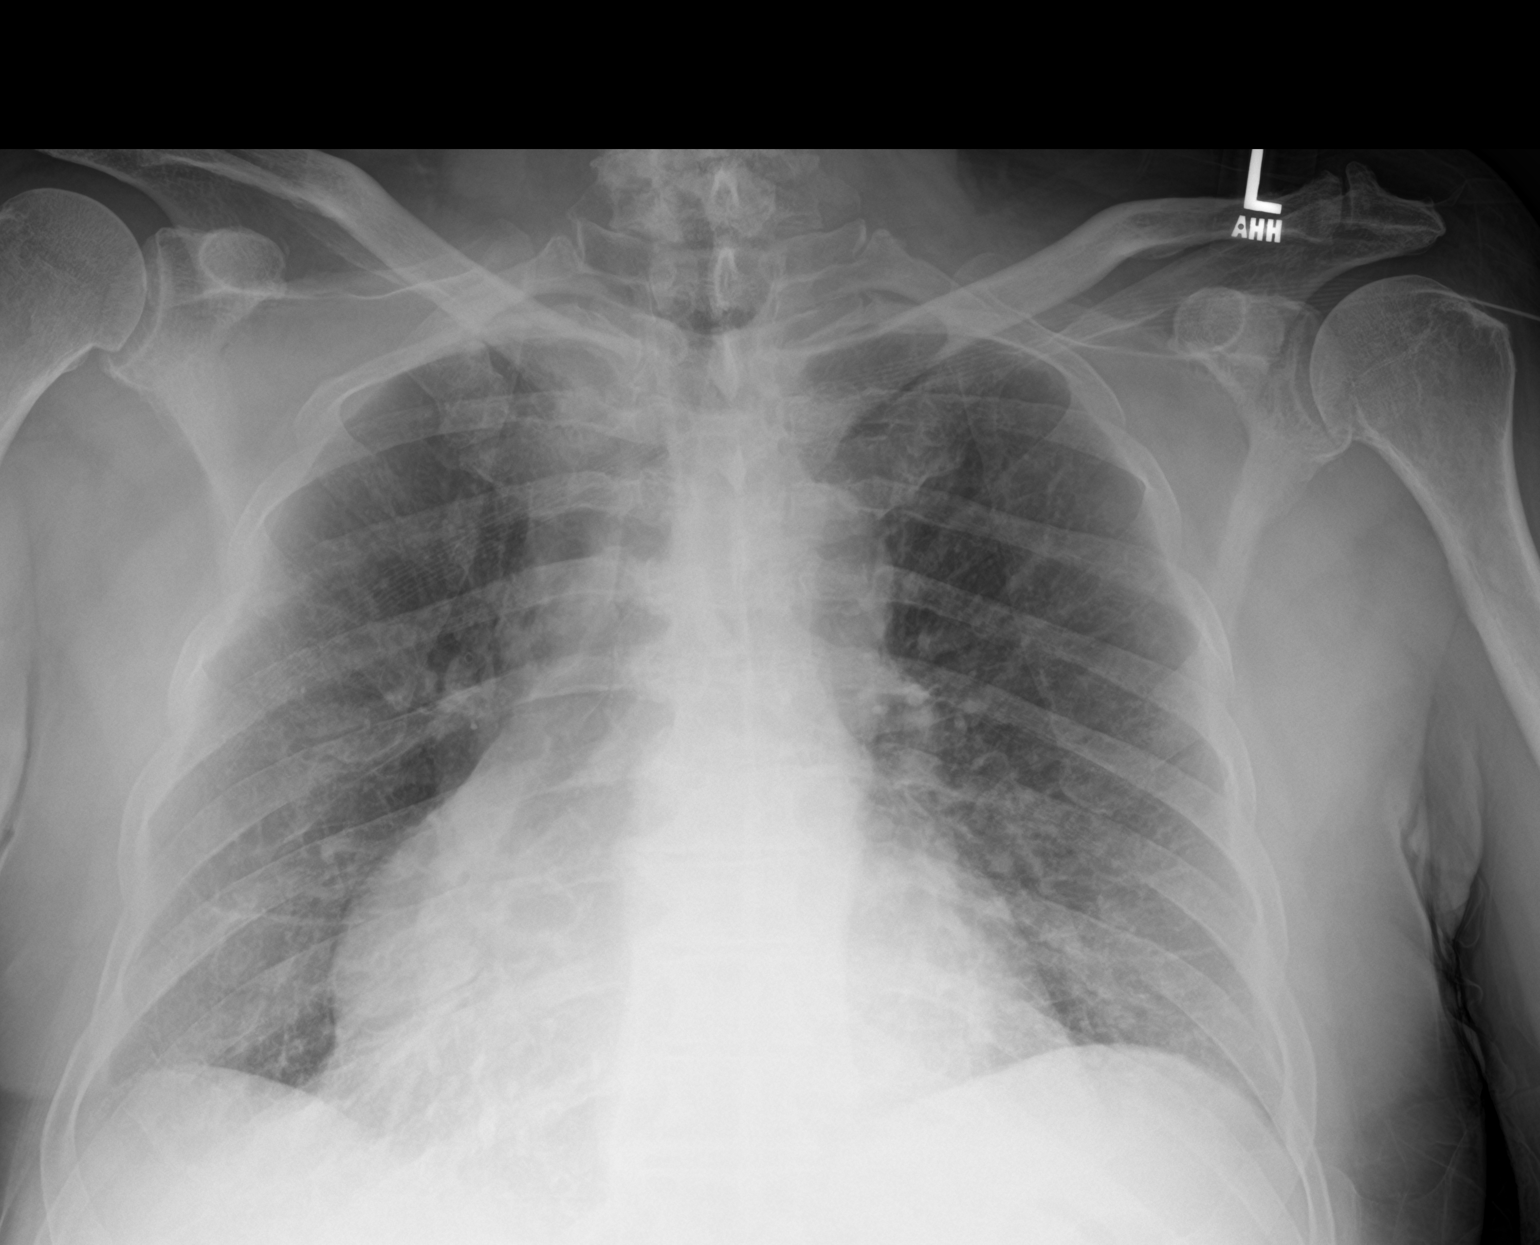

[2 of 2 positions shown; findings below may reference images not displayed]

FINDINGS: Cardiomegaly. The hila and mediastinum are unchanged. No pulmonary
nodules or masses. No focal infiltrates. Minimal atelectasis in the
bases. No acute abnormalities.
IMPRESSION: No cause for fever identified.

## 2020-07-22 DEATH — deceased
